# Patient Record
Sex: Female | Born: 1970 | Race: White | Hispanic: No | Marital: Single | Smoking: Former smoker
Health system: Southern US, Community
[De-identification: ages and names within clinical notes are randomized; demographics above are authoritative.]

## PROBLEM LIST (undated history)

## (undated) DIAGNOSIS — I10 Essential (primary) hypertension: Secondary | ICD-10-CM

## (undated) DIAGNOSIS — G43909 Migraine, unspecified, not intractable, without status migrainosus: Secondary | ICD-10-CM

## (undated) DIAGNOSIS — E119 Type 2 diabetes mellitus without complications: Secondary | ICD-10-CM

## (undated) DIAGNOSIS — F319 Bipolar disorder, unspecified: Secondary | ICD-10-CM

## (undated) HISTORY — DX: Bipolar disorder, unspecified: F31.9

---
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
## 1998-02-16 ENCOUNTER — Other Ambulatory Visit: Admission: RE | Admit: 1998-02-16 | Discharge: 1998-02-16 | Payer: Self-pay | Admitting: Obstetrics and Gynecology

## 1998-03-22 ENCOUNTER — Ambulatory Visit (HOSPITAL_COMMUNITY): Admission: RE | Admit: 1998-03-22 | Discharge: 1998-03-22 | Payer: Self-pay | Admitting: Obstetrics and Gynecology

## 1999-12-21 ENCOUNTER — Ambulatory Visit (HOSPITAL_COMMUNITY): Admission: RE | Admit: 1999-12-21 | Discharge: 1999-12-21 | Payer: Self-pay | Admitting: Obstetrics and Gynecology

## 1999-12-21 ENCOUNTER — Encounter: Payer: Self-pay | Admitting: Obstetrics and Gynecology

## 2000-05-16 ENCOUNTER — Inpatient Hospital Stay (HOSPITAL_COMMUNITY): Admission: AD | Admit: 2000-05-16 | Discharge: 2000-05-18 | Payer: Self-pay | Admitting: Obstetrics and Gynecology

## 2000-09-22 ENCOUNTER — Emergency Department (HOSPITAL_COMMUNITY): Admission: EM | Admit: 2000-09-22 | Discharge: 2000-09-22 | Payer: Self-pay | Admitting: Emergency Medicine

## 2020-05-25 ENCOUNTER — Inpatient Hospital Stay (HOSPITAL_COMMUNITY)
Admission: EM | Admit: 2020-05-25 | Discharge: 2020-05-27 | DRG: 071 | Disposition: A | Discharge status: Home / Self Care | Payer: Self-pay | Attending: Internal Medicine | Admitting: Internal Medicine

## 2020-05-25 ENCOUNTER — Emergency Department (HOSPITAL_COMMUNITY): Payer: Self-pay

## 2020-05-25 ENCOUNTER — Encounter (HOSPITAL_COMMUNITY): Payer: Self-pay

## 2020-05-25 ENCOUNTER — Other Ambulatory Visit: Payer: Self-pay

## 2020-05-25 DIAGNOSIS — E538 Deficiency of other specified B group vitamins: Secondary | ICD-10-CM | POA: Diagnosis present

## 2020-05-25 DIAGNOSIS — R4182 Altered mental status, unspecified: Secondary | ICD-10-CM | POA: Diagnosis present

## 2020-05-25 DIAGNOSIS — R4701 Aphasia: Secondary | ICD-10-CM | POA: Diagnosis present

## 2020-05-25 DIAGNOSIS — G4700 Insomnia, unspecified: Secondary | ICD-10-CM | POA: Diagnosis present

## 2020-05-25 DIAGNOSIS — E119 Type 2 diabetes mellitus without complications: Secondary | ICD-10-CM | POA: Diagnosis present

## 2020-05-25 DIAGNOSIS — R258 Other abnormal involuntary movements: Secondary | ICD-10-CM

## 2020-05-25 DIAGNOSIS — Z79899 Other long term (current) drug therapy: Secondary | ICD-10-CM

## 2020-05-25 DIAGNOSIS — F419 Anxiety disorder, unspecified: Secondary | ICD-10-CM | POA: Diagnosis present

## 2020-05-25 DIAGNOSIS — G10 Huntington's disease: Secondary | ICD-10-CM | POA: Diagnosis present

## 2020-05-25 DIAGNOSIS — F319 Bipolar disorder, unspecified: Secondary | ICD-10-CM | POA: Diagnosis present

## 2020-05-25 DIAGNOSIS — Z20822 Contact with and (suspected) exposure to covid-19: Secondary | ICD-10-CM | POA: Diagnosis present

## 2020-05-25 DIAGNOSIS — E876 Hypokalemia: Secondary | ICD-10-CM | POA: Diagnosis present

## 2020-05-25 DIAGNOSIS — N390 Urinary tract infection, site not specified: Secondary | ICD-10-CM | POA: Diagnosis present

## 2020-05-25 DIAGNOSIS — R4781 Slurred speech: Principal | ICD-10-CM

## 2020-05-25 DIAGNOSIS — G255 Other chorea: Secondary | ICD-10-CM

## 2020-05-25 DIAGNOSIS — G9340 Encephalopathy, unspecified: Principal | ICD-10-CM | POA: Diagnosis present

## 2020-05-25 DIAGNOSIS — G4710 Hypersomnia, unspecified: Secondary | ICD-10-CM | POA: Diagnosis present

## 2020-05-25 DIAGNOSIS — Z87891 Personal history of nicotine dependence: Secondary | ICD-10-CM

## 2020-05-25 DIAGNOSIS — I10 Essential (primary) hypertension: Secondary | ICD-10-CM | POA: Diagnosis present

## 2020-05-25 HISTORY — DX: Migraine, unspecified, not intractable, without status migrainosus: G43.909

## 2020-05-25 HISTORY — DX: Essential (primary) hypertension: I10

## 2020-05-25 LAB — CBC WITH DIFFERENTIAL/PLATELET
Abs Immature Granulocytes: 0.02 10*3/uL (ref 0.00–0.07)
Basophils Absolute: 0 10*3/uL (ref 0.0–0.1)
Basophils Relative: 0 %
Eosinophils Absolute: 0 10*3/uL (ref 0.0–0.5)
Eosinophils Relative: 0 %
HCT: 43.9 % (ref 36.0–46.0)
Hemoglobin: 14.3 g/dL (ref 12.0–15.0)
Immature Granulocytes: 0 %
Lymphocytes Relative: 17 %
Lymphs Abs: 1.2 10*3/uL (ref 0.7–4.0)
MCH: 30.8 pg (ref 26.0–34.0)
MCHC: 32.6 g/dL (ref 30.0–36.0)
MCV: 94.4 fL (ref 80.0–100.0)
Monocytes Absolute: 0.5 10*3/uL (ref 0.1–1.0)
Monocytes Relative: 6 %
Neutro Abs: 5.6 10*3/uL (ref 1.7–7.7)
Neutrophils Relative %: 77 %
Platelets: 439 10*3/uL — ABNORMAL HIGH (ref 150–400)
RBC: 4.65 MIL/uL (ref 3.87–5.11)
RDW: 12.8 % (ref 11.5–15.5)
WBC: 7.3 10*3/uL (ref 4.0–10.5)
nRBC: 0 % (ref 0.0–0.2)

## 2020-05-25 LAB — COMPREHENSIVE METABOLIC PANEL
ALT: 32 U/L (ref 0–44)
AST: 112 U/L — ABNORMAL HIGH (ref 15–41)
Albumin: 4.5 g/dL (ref 3.5–5.0)
Alkaline Phosphatase: 84 U/L (ref 38–126)
Anion gap: 14 (ref 5–15)
BUN: 10 mg/dL (ref 6–20)
CO2: 22 mmol/L (ref 22–32)
Calcium: 9.3 mg/dL (ref 8.9–10.3)
Chloride: 98 mmol/L (ref 98–111)
Creatinine, Ser: 0.81 mg/dL (ref 0.44–1.00)
GFR, Estimated: >60 mL/min (ref >60)
Glucose, Bld: 100 mg/dL — ABNORMAL HIGH (ref 70–99)
Potassium: 3.6 mmol/L (ref 3.5–5.1)
Sodium: 134 mmol/L — ABNORMAL LOW (ref 135–145)
Total Bilirubin: 0.6 mg/dL (ref 0.3–1.2)
Total Protein: 8.9 g/dL — ABNORMAL HIGH (ref 6.5–8.1)

## 2020-05-25 LAB — RAPID URINE DRUG SCREEN, HOSP PERFORMED
Amphetamines: POSITIVE — AB
Barbiturates: NOT DETECTED
Benzodiazepines: POSITIVE — AB
Cocaine: NOT DETECTED
Opiates: NOT DETECTED
Tetrahydrocannabinol: NOT DETECTED

## 2020-05-25 LAB — URINALYSIS, ROUTINE W REFLEX MICROSCOPIC
Bilirubin Urine: NEGATIVE
Glucose, UA: NEGATIVE mg/dL
Ketones, ur: NEGATIVE mg/dL
Nitrite: POSITIVE — AB
Protein, ur: NEGATIVE mg/dL
Specific Gravity, Urine: 1.009 (ref 1.005–1.030)
WBC, UA: >50 WBC/hpf — ABNORMAL HIGH (ref 0–5)
pH: 6 (ref 5.0–8.0)

## 2020-05-25 LAB — ETHANOL: Alcohol, Ethyl (B): <10 mg/dL (ref <10)

## 2020-05-25 LAB — AMMONIA: Ammonia: 21 umol/L (ref 9–35)

## 2020-05-25 LAB — RESPIRATORY PANEL BY RT PCR (FLU A&B, COVID)
Influenza A by PCR: NEGATIVE
Influenza B by PCR: NEGATIVE
SARS Coronavirus 2 by RT PCR: NEGATIVE

## 2020-05-25 MED ORDER — LORAZEPAM 2 MG/ML IJ SOLN
1.0000 mg | Freq: Once | INTRAMUSCULAR | Status: AC
  Administered 2020-05-25: 1 mg via INTRAVENOUS
  Filled 2020-05-25: qty 1

## 2020-05-25 MED ORDER — ENOXAPARIN SODIUM 40 MG/0.4ML ~~LOC~~ SOLN
40.0000 mg | SUBCUTANEOUS | Status: DC
  Administered 2020-05-25 – 2020-05-26 (×2): 40 mg via SUBCUTANEOUS
  Filled 2020-05-25 (×2): qty 0.4

## 2020-05-25 MED ORDER — THIAMINE HCL 100 MG/ML IJ SOLN
100.0000 mg | Freq: Every day | INTRAMUSCULAR | Status: DC
  Administered 2020-05-25 – 2020-05-27 (×3): 100 mg via INTRAVENOUS
  Filled 2020-05-25 (×3): qty 2

## 2020-05-25 MED ORDER — ONDANSETRON HCL 4 MG/2ML IJ SOLN: 4.0000 mg | Freq: Four times a day (QID) | INTRAMUSCULAR | Status: DC | PRN

## 2020-05-25 MED ORDER — SODIUM CHLORIDE 0.9 % IV SOLN
INTRAVENOUS | Status: AC
Start: 2020-05-25 — End: 2020-05-26

## 2020-05-25 MED ORDER — ACETAMINOPHEN 650 MG RE SUPP: 650.0000 mg | Freq: Four times a day (QID) | RECTAL | Status: DC | PRN

## 2020-05-25 MED ORDER — ACETAMINOPHEN 325 MG PO TABS
650.0000 mg | ORAL_TABLET | Freq: Four times a day (QID) | ORAL | Status: DC | PRN
  Administered 2020-05-26 (×2): 650 mg via ORAL
  Filled 2020-05-25 (×2): qty 2

## 2020-05-25 MED ORDER — METOCLOPRAMIDE HCL 5 MG/ML IJ SOLN
5.0000 mg | Freq: Once | INTRAMUSCULAR | Status: AC
  Administered 2020-05-25: 5 mg via INTRAVENOUS
  Filled 2020-05-25: qty 2

## 2020-05-25 MED ORDER — SODIUM CHLORIDE 0.9 % IV SOLN
1.0000 g | INTRAVENOUS | Status: DC
  Administered 2020-05-25 – 2020-05-26 (×2): 1 g via INTRAVENOUS
  Filled 2020-05-25: qty 1
  Filled 2020-05-25 (×3): qty 10

## 2020-05-25 MED ORDER — ONDANSETRON HCL 4 MG PO TABS: 4.0000 mg | ORAL_TABLET | Freq: Four times a day (QID) | ORAL | Status: DC | PRN

## 2020-05-25 NOTE — ED Notes (Signed)
Patient states she is unable to provide a urine sample at this time.

## 2020-05-25 NOTE — Consult Note (Signed)
NEURO HOSPITALIST CONSULT NOTE   Requestig physician: Dr. Freida Busman  Reason for Consult: AMS and chorea  History obtained from:  Patient, Husband and Chart   HPI:                                                                                                                                          Glenda Smith is an 49 y.o. female with a PMHx of DM, HTN and migraine, who presented to the [facility] ED with intermittent episodes of confusion and slurred speech.  Husband states that these last for several minutes and then resolve spontaneously.  She has had cognitive and memory problems with gradual worsening over the past 2 years, per husband. Also was diagnosed with bipolar disorder in the past 2 years. She had no headache on presenting, but now complains of a 6/10 headache. She has some discomfort behind her eyes but this is not painful. No vision loss, but she has been experiencing abnormal upward eye movements recently. In addition, she has had writhing movements of her body today, with some such movements intermittently over the past 3 months, per husband. No family history of chorea. No seizure like episodes. She has no fever or neck stiffness. Also with no recent medication changes. Husband denies any illicit substance or alcohol abuse.    Past Medical History:  Diagnosis Date  . Diabetes mellitus without complication (HCC)   . Hypertension   . Migraine     History reviewed. No pertinent surgical history.  History reviewed. No pertinent family history.            Social History:  reports that she has quit smoking. She has never used smokeless tobacco. No history on file for alcohol use and drug use.  No Known Allergies  MEDICATIONS:                                                                                                                      No current facility-administered medications on file prior to encounter.   Current Outpatient Medications on File Prior to  Encounter  Medication Sig Dispense Refill  . ALPRAZolam (XANAX) 0.5 MG tablet Take 0.5 mg by mouth 4 (four) times daily as needed for anxiety or sleep.     Marland Kitchen  amphetamine-dextroamphetamine (ADDERALL) 20 MG tablet Take 20 mg by mouth daily.     . cholecalciferol (VITAMIN D3) 25 MCG (1000 UNIT) tablet Take 1,000 Units by mouth daily.    Marland Kitchen ibuprofen (ADVIL) 200 MG tablet Take 400 mg by mouth every 6 (six) hours as needed for moderate pain.       ROS:                                                                                                                                       As per HPI. Comprehensive ROS otherwise negative.   Blood pressure 126/77, pulse 81, temperature 99.4 F (37.4 C), temperature source Oral, resp. rate 16, height 5\' 3"  (1.6 m), weight 65.8 kg, SpO2 93 %.   General Examination:                                                                                                       Physical Exam  HEENT-  [place]/AT    Lungs- Respirations unlabored Extremities- No edema   Neurological Examination Mental Status: Awake and alert. Fully oriented x 5. Speech fluent with intact naming and comprehension. Mild intermittent dysarthria.  Cranial Nerves: II: Visual fields intact bilaterally. PERRL, 5 mm >> 3 mm.   III,IV, VI: No ptosis. Has intermittent conjugate supraduction of eyes as well as looking up and to the left that resolves when she is asked to fixate on examiner's face and other objects. The abnormal-appearing eye movements are also associated with spasmodic appearing eye closure and increased dysarthria and choreiform movements of her neck, all of which resolve when distracted to perform other tasks.  V,VII: Smile symmetric, facial temp sensation equal bilaterally VIII: hearing intact to voice IX,X: Palate rises symmetrically XI: Symmetric shoulder shrug XII: midline tongue extension Motor: Inconsistent effort on exam. Maximum strength elicited is 4+/5 x 4. Needs to  be coached to resist examiner with max strength. Intermittent choreiform movements of all 4 extremities wax and wane but were not consistently distractible.  Sensory: Temp and light touch intact throughout, bilaterally. No extinction to DSS.  Deep Tendon Reflexes: 2+ and symmetric BUE. 3+ patellae.  Plantars: Right: downgoing   Left: downgoing Cerebellar: No ataxia with FNF bilaterally Gait: Able to ambulate without assistance, but there is mild choreiform "dancing" like quality to her gait.    Lab Results: Basic Metabolic Panel: Recent Labs  Lab 05/25/20 1800  NA 134*  K 3.6  CL 98  CO2 22  GLUCOSE  100*  BUN 10  CREATININE 0.81  CALCIUM 9.3    CBC: Recent Labs  Lab 05/25/20 1800  WBC 7.3  NEUTROABS 5.6  HGB 14.3  HCT 43.9  MCV 94.4  PLT 439*    Cardiac Enzymes: No results for input(s): CKTOTAL, CKMB, CKMBINDEX, TROPONINI in the last 168 hours.  Lipid Panel: No results for input(s): CHOL, TRIG, HDL, CHOLHDL, VLDL, LDLCALC in the last 168 hours.  Imaging: CT Head Wo Contrast  Result Date: 05/25/2020 CLINICAL DATA:  Slurred speech, rambling, confusion. Last known normal at noon. Cerebral hemorrhage suspected. EXAM: CT HEAD WITHOUT CONTRAST TECHNIQUE: Contiguous axial images were obtained from the base of the skull through the vertex without intravenous contrast. COMPARISON:  None. FINDINGS: Brain: No evidence of large-territorial acute infarction. No parenchymal hemorrhage. No mass lesion. No extra-axial collection. No mass effect or midline shift. No hydrocephalus. Basilar cisterns are patent. Vascular: No hyperdense vessel. Skull: No acute fracture or focal lesion. Sinuses/Orbits: Paranasal sinuses and mastoid air cells are clear. The orbits are unremarkable. Other: None. IMPRESSION: No acute intracranial abnormality. If high clinical suspicion for acute stroke, please consider MR brain noncontrast for further/mores sensitive evaluation. Electronically Signed   By: Tish FredericksonMorgane   Naveau M.D.   On: 05/25/2020 19:08   MR Brain Wo Contrast (neuro protocol)  Result Date: 05/25/2020 CLINICAL DATA:  Initial evaluation for acute slurred speech, confusion. EXAM: MRI HEAD WITHOUT CONTRAST TECHNIQUE: Multiplanar, multiecho pulse sequences of the brain and surrounding structures were obtained without intravenous contrast. COMPARISON:  Prior CT from earlier same day. FINDINGS: Brain: Cerebral volume within normal limits for patient age. Single subcentimeter focus of FLAIR hyperintensity noted within the white matter of the right frontal corona radiata, nonspecific, but of doubtful significance in isolation. No other focal parenchymal signal abnormality. No abnormal foci of restricted diffusion to suggest acute or subacute ischemia. Gray-white matter differentiation well maintained. No encephalomalacia to suggest chronic infarction. No foci of susceptibility artifact to suggest acute or chronic intracranial hemorrhage. No mass lesion, midline shift or mass effect. No hydrocephalus. No extra-axial fluid collection. Major dural sinuses are grossly patent. Pituitary gland and suprasellar region are normal. Midline structures intact and normal. Vascular: Major intracranial vascular flow voids well maintained and normal in appearance. Skull and upper cervical spine: Craniocervical junction normal. Visualized upper cervical spine within normal limits. Bone marrow signal intensity normal. No scalp soft tissue abnormality. Sinuses/Orbits: Globes and orbital soft tissues within normal limits. Minimal mucosal thickening noted within the ethmoidal air cells. Paranasal sinuses are otherwise clear. No mastoid effusion. Inner ear structures grossly normal. Other: None. IMPRESSION: Normal brain MRI. No acute intracranial abnormality identified. Electronically Signed   By: Rise MuBenjamin  McClintock M.D.   On: 05/25/2020 22:56    Assessment: 49 year old female with a 2 year history of progressive memory problems and  confusion, approximately 3 month history of intermittent choreiform movements, presenting after an episode of slurred speech, rambling and confusion witnessed by husband 1. Exam reveals choreiform movements of all limbs that vary in amplitude and frequency, in conjunction with mild slurring of speech and facial as well as ocular dyskinetic movements.  2. Exam findings best localize to the basal ganglia, most likely diffuse involvement, versus psychogenic movements. Also on the DDx is effect of possible toxic ingestion. No exposure to toxic solvents or carbon monoxide per husband. She has no family history of chorea, but Huntington's chorea is also a differential diagnostic consideration.  3. MRI brain is normal.  4. Was diagnosed  with bipolar disorder in the past 2 years. Of note, mood disorders can occur in both paraneoplastic syndromes and Huntington's chorea 5. Regarding the paraneoplastic component of the DDx, per the literature the most frequently associated cancer in paraneoplastic syndromes resulting in chorea was small cell lung cancer (SCLC); lymphoma, bowel, or kidney cancers were also reported. CV2/CRMP5 was the most frequently associated antibody, followed by Hu. Hyperintense lesions of the basal ganglia on T2-weighted images were seldom observed.  6. A mild serotonin syndrome is possible. Would discontinue her Adderall and avoid SSRIs, tricyclics and other medications with serotonergic activity.  7. Lab testing for possible Huntington's may need to be undertaken as an outpatient. The Huntington's Disease Society of [place] recommends that at risk individuals who are considering genetic testing do so at a genetic testing center that follows the HDSA guidelines. Testing procedures at these centers involves sessions with professionals who are knowledgeable about HD and the local services available. It may take several weeks to receive the results once the genetic test is  complete.  Recommendations: 1. May need an LP in the morning to assess for inflammatory markers in her CSF. Would most likely need to be obtained under sedation with fluoroscopy due to her choreiform movements.  2. Paraneoplastic work up to include CT of chest, abdomen and pelvis.  3. Serum anti-NMDA antibody titer, anti Hu antibody titer and  anti CRMP5 antibody titer. These have been ordered as a Clinical biochemist.   Electronically signed: Dr. Caryl Pina 05/25/2020, 11:37 PM

## 2020-05-25 NOTE — ED Notes (Signed)
Patient transported to MRI 

## 2020-05-25 NOTE — ED Triage Notes (Signed)
Per husband slurred speech, rambling, and confusion. LKN 1200.

## 2020-05-25 NOTE — ED Provider Notes (Signed)
[facility]-EMERGENCY DEPT Provider Note   CSN: [id number] Arrival date & time: 05/25/20  1748     History Chief Complaint  Patient presents with  . Aphasia    Glenda Smith is a 49 y.o. female.  49 year old female presents with intermittent episodes of confusion with slurred speech.  Husband states that these last for several minutes.  They resolve spontaneously.  No prior history of same.  She denies any recent fever or headache.  No changes to her medications.  Has had multiple episodes of this today.  She denies any use of alcohol or drugs.  She is not fully vaccinated        Past Medical History:  Diagnosis Date  . Diabetes mellitus without complication (HCC)   . Hypertension   . Migraine     There are no problems to display for this patient.   History reviewed. No pertinent surgical history.   OB History   No obstetric history on file.     History reviewed. No pertinent family history.  Social History   Tobacco Use  . Smoking status: Former Games developer  . Smokeless tobacco: Never Used  Substance Use Topics  . Alcohol use: Not on file  . Drug use: Not on file    Home Medications Prior to Admission medications   Not on File    Allergies    Patient has no known allergies.  Review of Systems   Review of Systems  All other systems reviewed and are negative.   Physical Exam Updated Vital Signs BP (!) 142/95   Pulse 82   Temp 99.4 F (37.4 C) (Oral)   Resp 16   Ht 1.6 m (5\' 3" )   Wt 65.8 kg   SpO2 99%   BMI 25.69 kg/m   Physical Exam Vitals and nursing note reviewed.  Constitutional:      General: She is not in acute distress.    Appearance: Normal appearance. She is well-developed. She is not toxic-appearing.  HENT:     Head: Normocephalic and atraumatic.  Eyes:     General: Lids are normal.     Conjunctiva/sclera: Conjunctivae normal.     Pupils: Pupils are equal, round, and reactive to light.  Neck:      Thyroid: No thyroid mass.     Trachea: No tracheal deviation.  Cardiovascular:     Rate and Rhythm: Normal rate and regular rhythm.     Heart sounds: Normal heart sounds. No murmur heard.  No gallop.   Pulmonary:     Effort: Pulmonary effort is normal. No respiratory distress.     Breath sounds: Normal breath sounds. No stridor. No decreased breath sounds, wheezing, rhonchi or rales.  Abdominal:     General: Bowel sounds are normal. There is no distension.     Palpations: Abdomen is soft.     Tenderness: There is no abdominal tenderness. There is no rebound.  Musculoskeletal:        General: No tenderness. Normal range of motion.     Cervical back: Normal range of motion and neck supple.  Skin:    General: Skin is warm and dry.     Findings: No abrasion or rash.  Neurological:     Mental Status: She is alert and oriented to person, place, and time.     GCS: GCS eye subscore is 4. GCS verbal subscore is 5. GCS motor subscore is 6.     Cranial Nerves: No cranial nerve deficit.  Sensory: No sensory deficit.     Motor: Weakness and tremor present.     Coordination: Finger-Nose-Finger Test abnormal.  Psychiatric:        Attention and Perception: Attention normal.        Mood and Affect: Affect is flat.        Speech: Speech is delayed.        Behavior: Behavior is slowed and withdrawn.     ED Results / Procedures / Treatments   Labs (all labs ordered are listed, but only abnormal results are displayed) Labs Reviewed  AMMONIA  CBC WITH DIFFERENTIAL/PLATELET  COMPREHENSIVE METABOLIC PANEL  ETHANOL  RAPID URINE DRUG SCREEN, HOSP PERFORMED  URINALYSIS, ROUTINE W REFLEX MICROSCOPIC    EKG None  Radiology No results found.  Procedures Procedures (including critical care time)  Medications Ordered in ED Medications  0.9 %  sodium chloride infusion (has no administration in time range)    ED Course  I have reviewed the triage vital signs and the nursing  notes.  Pertinent labs & imaging results that were available during my care of the patient were reviewed by me and considered in my medical decision making (see chart for details).    MDM Rules/Calculators/A&P                          Head CT negative.  Labs are reassuring here.  Discussed with Dr. Otelia Limes from neurology who has seen the patient in the ER.  Recommends hospitalist admission as well as MRI of brain without contrast Final Clinical Impression(s) / ED Diagnoses Final diagnoses:  None    Rx / DC Orders ED Discharge Orders    None       Lorre Nick, MD 05/25/20 2149

## 2020-05-25 NOTE — H&P (Signed)
[facility] History and Physical  Glenda Smith CBS:[id number] DOB: Feb 25, 1971 DOA: 05/25/2020   PCP: Patient, No Pcp Per  Specialists: None  Chief Complaint: Altered mental status  HPI: Glenda Smith is a 49 y.o. female with a past medical history of bipolar disorder, anxiety disorder who lives with her husband who was brought in due to intermittent episodes of confusion.  Currently patient is minimally responsive and is unable to provide any history.  Most of the history was obtained from the patient's husband.  Apparently for the past many weeks patient has had episodes where she would suddenly lose balance and fall down.  She would have episodes where her eyes were rolled back into her head.  Husbands states that there would days when she would not be able to sleep for 48 hours and then will be sleeping for 24 to 48 hours.  Over the past 2 days patient was noted to speak sentences which did not made any sense.  She was noted to have slurred speech.  Husband did not notice any weakness on any one side of the body.  No seizure type episode.  No nausea vomiting has been noted.  History is limited.  In the emergency department evaluation revealed normal WBC.  Sodium was 134.  AST noted to be 112.  Ammonia level 21.  CT head was unremarkable.  MRI brain was subsequently done which was also negative for any acute process.  Urine drug screen was positive for amphetamines and benzodiazepine.  UA noted to be abnormal with large leukocytes positive nitrite many bacteria and greater than 50 WBC.  Home Medications: Prior to Admission medications   Medication Sig Start Date End Date Taking? Authorizing Provider  ALPRAZolam Prudy Feeler) 0.5 MG tablet Take 0.5 mg by mouth 4 (four) times daily as needed for anxiety or sleep.  04/21/20  Yes [provider]  amphetamine-dextroamphetamine (ADDERALL) 20 MG tablet Take 20 mg by mouth daily.  04/22/20  Yes [provider]  cholecalciferol  (VITAMIN D3) 25 MCG (1000 UNIT) tablet Take 1,000 Units by mouth daily.   Yes [provider]  ibuprofen (ADVIL) 200 MG tablet Take 400 mg by mouth every 6 (six) hours as needed for moderate pain.   Yes [provider]    Allergies: No Known Allergies  Past Medical History: Past Medical History:  Diagnosis Date  . Diabetes mellitus without complication (HCC)   . Hypertension   . Migraine     History reviewed. No pertinent surgical history.  Social History: Lives with her husband.  No history of smoking alcohol use.  Family History:  Unable to obtain at this time due to her altered mentation  Review of Systems -unable to do due to altered mental status  Physical Examination  Vitals:   05/25/20 1930 05/25/20 2000 05/25/20 2100 05/25/20 2130  BP: 131/81 131/81 130/85 126/77  Pulse: 81 80 83 81  Resp: 16 (!) 21 17 16   Temp:      TempSrc:      SpO2: 97% 99% 97% 93%  Weight:      Height:        BP 126/77   Pulse 81   Temp 99.4 F (37.4 C) (Oral)   Resp 16   Ht 5\' 3"  (1.6 m)   Wt 65.8 kg   SpO2 93%   BMI 25.69 kg/m   General appearance: no distress and slowed mentation Head: Normocephalic, without obvious abnormality, atraumatic Eyes: conjunctivae/corneas clear. PERRL, Throat: lips,  mucosa, and tongue normal; teeth and gums normal Neck: no adenopathy, no carotid bruit, no JVD, supple, symmetrical, trachea midline and thyroid not enlarged, symmetric, no tenderness/mass/nodules Resp: clear to auscultation bilaterally Cardio: regular rate and rhythm, S1, S2 normal, no murmur, click, rub or gallop GI: soft, non-tender; bowel sounds normal; no masses,  no organomegaly Extremities: extremities normal, atraumatic, no cyanosis or edema Pulses: 2+ and symmetric Skin: Skin color, texture, turgor normal. No rashes or lesions Lymph nodes: Cervical, supraclavicular, and axillary nodes normal. Neurologic: Opens her eyes on voice command.  Does not answer any  orientation questions.  Does squeeze my fingers equally with both her hands.  Able to move her legs.  No obvious focal deficits noted.  No facial asymmetry.   Labs on Admission: I have personally reviewed following labs and imaging studies  CBC: Recent Labs  Lab 05/25/20 1800  WBC 7.3  NEUTROABS 5.6  HGB 14.3  HCT 43.9  MCV 94.4  PLT 439*   Basic Metabolic Panel: Recent Labs  Lab 05/25/20 1800  NA 134*  K 3.6  CL 98  CO2 22  GLUCOSE 100*  BUN 10  CREATININE 0.81  CALCIUM 9.3   GFR: Estimated Creatinine Clearance: 75 mL/min (by C-G formula based on SCr of 0.81 mg/dL). Liver Function Tests: Recent Labs  Lab 05/25/20 1800  AST 112*  ALT 32  ALKPHOS 84  BILITOT 0.6  PROT 8.9*  ALBUMIN 4.5   No results for input(s): LIPASE, AMYLASE in the last 168 hours. Recent Labs  Lab 05/25/20 1923  AMMONIA 21     Radiological Exams on Admission: CT Head Wo Contrast  Result Date: 05/25/2020 CLINICAL DATA:  Slurred speech, rambling, confusion. Last known normal at noon. Cerebral hemorrhage suspected. EXAM: CT HEAD WITHOUT CONTRAST TECHNIQUE: Contiguous axial images were obtained from the base of the skull through the vertex without intravenous contrast. COMPARISON:  None. FINDINGS: Brain: No evidence of large-territorial acute infarction. No parenchymal hemorrhage. No mass lesion. No extra-axial collection. No mass effect or midline shift. No hydrocephalus. Basilar cisterns are patent. Vascular: No hyperdense vessel. Skull: No acute fracture or focal lesion. Sinuses/Orbits: Paranasal sinuses and mastoid air cells are clear. The orbits are unremarkable. Other: None. IMPRESSION: No acute intracranial abnormality. If high clinical suspicion for acute stroke, please consider MR brain noncontrast for further/mores sensitive evaluation. Electronically Signed   By: Tish Frederickson M.D.   On: 05/25/2020 19:08   MR Brain Wo Contrast (neuro protocol)  Result Date: 05/25/2020 CLINICAL DATA:   Initial evaluation for acute slurred speech, confusion. EXAM: MRI HEAD WITHOUT CONTRAST TECHNIQUE: Multiplanar, multiecho pulse sequences of the brain and surrounding structures were obtained without intravenous contrast. COMPARISON:  Prior CT from earlier same day. FINDINGS: Brain: Cerebral volume within normal limits for patient age. Single subcentimeter focus of FLAIR hyperintensity noted within the white matter of the right frontal corona radiata, nonspecific, but of doubtful significance in isolation. No other focal parenchymal signal abnormality. No abnormal foci of restricted diffusion to suggest acute or subacute ischemia. Gray-white matter differentiation well maintained. No encephalomalacia to suggest chronic infarction. No foci of susceptibility artifact to suggest acute or chronic intracranial hemorrhage. No mass lesion, midline shift or mass effect. No hydrocephalus. No extra-axial fluid collection. Major dural sinuses are grossly patent. Pituitary gland and suprasellar region are normal. Midline structures intact and normal. Vascular: Major intracranial vascular flow voids well maintained and normal in appearance. Skull and upper cervical spine: Craniocervical junction normal. Visualized upper cervical spine within  normal limits. Bone marrow signal intensity normal. No scalp soft tissue abnormality. Sinuses/Orbits: Globes and orbital soft tissues within normal limits. Minimal mucosal thickening noted within the ethmoidal air cells. Paranasal sinuses are otherwise clear. No mastoid effusion. Inner ear structures grossly normal. Other: None. IMPRESSION: Normal brain MRI. No acute intracranial abnormality identified. Electronically Signed   By: Rise Mu M.D.   On: 05/25/2020 22:56      Problem List  Active Problems:   AMS (altered mental status)   Assessment: This is a 49 year old Caucasian female with past medical history as stated earlier who comes in with a few week history of  loss of balance, slurred speech, hypersomnolence.  Work-up so far has been negative for any active neurological process.  Her UA however is noted to be abnormal. It is quite likely she has a UTI.  However her symptoms are very longstanding.  Could be due to an underlying psychiatric illness.  Patient does have a known history of bipolar disorder.  Plan:  1. Acute encephalopathy: Could be due to UTI but see discussion above.  We will treat her with antibiotics for the UTI.  Neurology was consulted by ED provider.  MRI brain is negative.  We will proceed with EEG.  Do metabolic work-up.  Ammonia level was noted to be normal.  Also consult psychiatry to evaluate her.  2. Urinary tract infection: Follow-up urine culture.  Treat with ceftriaxone for now.  3.  History of bipolar disorder: According to her husband she is followed by a psychiatrist, Dr. Evelene Croon.  Noted to be on alprazolam and Adderall at home which will be held for now.     DVT Prophylaxis: Lovenox Code Status: Full code Family Communication: Discussed with her husband Disposition: Hopefully return home in improved Consults called: Neurology, psychiatry Admission Status: Status is: Observation  The patient remains OBS appropriate and will d/c before 2 midnights.  Dispo: The patient is from: Home              Anticipated d/c is to: Home              Anticipated d/c date is: 1 day              Patient currently is not medically stable to d/c.     Severity of Illness: The appropriate patient status for this patient is OBSERVATION. Observation status is judged to be reasonable and necessary in order to provide the required intensity of service to ensure the patient's safety. The patient's presenting symptoms, physical exam findings, and initial radiographic and laboratory data in the context of their medical condition is felt to place them at decreased risk for further clinical deterioration. Furthermore, it is anticipated that the  patient will be medically stable for discharge from the hospital within 2 midnights of admission. The following factors support the patient status of observation.   " The patient's presenting symptoms include altered mental status. " The physical exam findings include minimally responsive. " The initial radiographic and laboratory data are concerning for UTI.     Further management decisions will depend on results of further testing and patient's response to treatment.   Malekai [facility]  [facility] [employer] on [email or web address]  05/25/2020, 11:13 PM

## 2020-05-26 ENCOUNTER — Observation Stay (HOSPITAL_COMMUNITY)
Admit: 2020-05-26 | Discharge: 2020-05-26 | Disposition: A | Discharge status: Home / Self Care | Payer: Self-pay | Attending: Internal Medicine | Admitting: Internal Medicine

## 2020-05-26 DIAGNOSIS — E876 Hypokalemia: Secondary | ICD-10-CM

## 2020-05-26 DIAGNOSIS — R258 Other abnormal involuntary movements: Secondary | ICD-10-CM

## 2020-05-26 DIAGNOSIS — Z8659 Personal history of other mental and behavioral disorders: Secondary | ICD-10-CM

## 2020-05-26 DIAGNOSIS — R4182 Altered mental status, unspecified: Secondary | ICD-10-CM | POA: Diagnosis present

## 2020-05-26 DIAGNOSIS — G9341 Metabolic encephalopathy: Secondary | ICD-10-CM

## 2020-05-26 LAB — HEPATITIS PANEL, ACUTE
HCV Ab: NONREACTIVE
Hep A IgM: NONREACTIVE
Hep B C IgM: NONREACTIVE
Hepatitis B Surface Ag: NONREACTIVE

## 2020-05-26 LAB — COMPREHENSIVE METABOLIC PANEL
ALT: 33 U/L (ref 0–44)
AST: 124 U/L — ABNORMAL HIGH (ref 15–41)
Albumin: 3.4 g/dL — ABNORMAL LOW (ref 3.5–5.0)
Alkaline Phosphatase: 61 U/L (ref 38–126)
Anion gap: 10 (ref 5–15)
BUN: 9 mg/dL (ref 6–20)
CO2: 21 mmol/L — ABNORMAL LOW (ref 22–32)
Calcium: 8.3 mg/dL — ABNORMAL LOW (ref 8.9–10.3)
Chloride: 105 mmol/L (ref 98–111)
Creatinine, Ser: 0.74 mg/dL (ref 0.44–1.00)
GFR, Estimated: >60 mL/min (ref >60)
Glucose, Bld: 96 mg/dL (ref 70–99)
Potassium: 3.3 mmol/L — ABNORMAL LOW (ref 3.5–5.1)
Sodium: 136 mmol/L (ref 135–145)
Total Bilirubin: 0.8 mg/dL (ref 0.3–1.2)
Total Protein: 6.7 g/dL (ref 6.5–8.1)

## 2020-05-26 LAB — VITAMIN B12: Vitamin B-12: 236 pg/mL (ref 180–914)

## 2020-05-26 LAB — CBC
HCT: 37.8 % (ref 36.0–46.0)
Hemoglobin: 11.7 g/dL — ABNORMAL LOW (ref 12.0–15.0)
MCH: 30.4 pg (ref 26.0–34.0)
MCHC: 31 g/dL (ref 30.0–36.0)
MCV: 98.2 fL (ref 80.0–100.0)
Platelets: 329 10*3/uL (ref 150–400)
RBC: 3.85 MIL/uL — ABNORMAL LOW (ref 3.87–5.11)
RDW: 12.8 % (ref 11.5–15.5)
WBC: 4.7 10*3/uL (ref 4.0–10.5)
nRBC: 0 % (ref 0.0–0.2)

## 2020-05-26 LAB — GLUCOSE, CAPILLARY: Glucose-Capillary: 83 mg/dL (ref 70–99)

## 2020-05-26 LAB — RPR: RPR Ser Ql: NONREACTIVE

## 2020-05-26 LAB — TSH: TSH: 9.362 u[IU]/mL — ABNORMAL HIGH (ref 0.350–4.500)

## 2020-05-26 LAB — T4, FREE: Free T4: 0.67 ng/dL (ref 0.61–1.12)

## 2020-05-26 LAB — HIV ANTIBODY (ROUTINE TESTING W REFLEX): HIV Screen 4th Generation wRfx: NONREACTIVE

## 2020-05-26 MED ORDER — VITAMIN B-12 1000 MCG PO TABS: 1000.0000 ug | ORAL_TABLET | Freq: Every day | ORAL | Status: DC

## 2020-05-26 MED ORDER — TRAZODONE HCL 50 MG PO TABS
50.0000 mg | ORAL_TABLET | Freq: Once | ORAL | Status: AC
  Administered 2020-05-26: 50 mg via ORAL
  Filled 2020-05-26: qty 1

## 2020-05-26 MED ORDER — POTASSIUM CHLORIDE 10 MEQ/100ML IV SOLN
10.0000 meq | INTRAVENOUS | Status: AC
  Administered 2020-05-26 (×3): 10 meq via INTRAVENOUS
  Filled 2020-05-26 (×2): qty 100

## 2020-05-26 MED ORDER — CYANOCOBALAMIN 1000 MCG/ML IJ SOLN
1000.0000 ug | Freq: Every day | INTRAMUSCULAR | Status: DC
  Administered 2020-05-26 – 2020-05-27 (×2): 1000 ug via INTRAMUSCULAR
  Filled 2020-05-26 (×4): qty 1

## 2020-05-26 NOTE — Procedures (Signed)
Patient Name: Glenda Smith  MRN: [id number]  Epilepsy Attending: Charlsie Quest  Referring Physician/Provider: Dr Osvaldo Shipper Date: 05/26/2020 Duration: 23.54 mins  Patient history: 49 year old female with 2-year history of progressive memory problems and confusion, 63-month history of intermittent choreiform movements who presented after an episode of slurred speech, having confusion.  EEG to evaluate for seizures.  Level of alertness: Awake,asleep  AEDs during EEG study: None  Technical aspects: This EEG study was done with scalp electrodes positioned according to the 10-20 International system of electrode placement. Electrical activity was acquired at a sampling rate of 500Hz  and reviewed with a high frequency filter of 70Hz  and a low frequency filter of 1Hz . EEG data were recorded continuously and digitally stored.   Description: The posterior dominant rhythm consists of 9-10 Hz activity of moderate voltage (25-35 uV) seen predominantly in posterior head regions, symmetric and reactive to eye opening and eye closing. Sleep was characterized by vertex waves, sleep spindles (12 to 14 Hz), maximal frontocentral region.   Physiologic photic driving was seen during photic stimulation.  Hyperventilation was not performed.     IMPRESSION: This study is within normal limits. No seizures or epileptiform discharges were seen throughout the recording.  Hao Dion 

## 2020-05-26 NOTE — Progress Notes (Signed)
Neurology Progress Note  Patient ID: Glenda Smith is a 49 y.o. with PMHx of  has a past medical history of Diabetes mellitus without complication ([place]), Hypertension, and Migraine. and mood disorder   Initially consulted for:  Intermittent episodes of confusion, slurred speech and new-onset choreiform movements  Major interval events:  - Psych eval completed, recommending d/c stimulants  Subjective: - Reports feeling generally a bit better - Feels her movements are slightly improved and states she's been fidgety her whole life - contemplative about stopping her stimulant   Exam: Vitals:   05/26/20 2010 05/27/20 0410  BP: 111/73 119/74  Pulse: 76 64  Resp:    Temp: 98.4 F (36.9 C) 98 F (36.7 C)  SpO2: 97% 99%   Gen: In bed, comfortable  Resp: non-labored breathing, no grossly audible wheezing Cardiac: Perfusing extremities well  Abd: soft, nt  Neuro: MS: Alert, awake, oriented to person/place/situation/day of the week, month and year CN: face symmetric, EOMI Motor: Ambulates easily, rises on toes and heels without issue, mildly unstable tandem gait patient attributes to being tired (6:40 AM). Continues to have intermittent dance-like movements involving face, eyebrows, and all four limbs, symmetrically  Pertinent Data: - RPR and HIV negative - TSH 9.362, T4 0.67 - B12 236 - AST elevated at 124 - Ammonia 21  - MMA, intrinsic factor antibodies pending - Thiamine level pending - [facility] Movement Disorder, Autoimmune Evaluation, Serum pending  11/4 EEG completed, normal   Impression: 49 y.o. with possibly provoked secondary to stimulant use.   Acquired causes of chorea include structural lesions (strokes, tumors, demyelinating lesions) metabolic/endocrine issues (nonketotic hyperglycemia, hypoglycemia, hypocalcemia, hypo or hypernatremia, uremia, acquired hepatic cerebral degeneration, hypothyroidism, hyperparathyroidism and hyperparathyroidism), infectious  etiologies (HIV, toxoplasmosis), drug-induced (levodopa, cocaine, amphetamines, anticonvulsants, lithium, anticholinergics, neuroleptic withdrawal), autoimmune/paraneoplastic process   Given broad differential for chorea, work-up is extensive.  Standard first tier testing includes complete blood count, thyroid function tests, liver function tests, serum electrolytes, serum calcium, ANA, anti-dsDNA, lupus anticoagulant, antiphospholipid antibody syndrome. In patients with a subacute temporal profile antibody testing in both serum and CSF; however, patient is declining CSF studies at this time as she does not like the idea of an LP. She is willing to consider this on an outpatient basis if she continues to decline; given she feels she is improving at this time, it can be deferred to the outpatient setting.   Imaging should include MRI brain with consideration of CT chest abdomen pelvis for malignancy screening.  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   Regarding the potential for genetic causes of chorea this should be evaluated on an outpatient basis in a specialized center; given the impact of diagnosis, genetic counseling prior to testing is standard of care.  Reference: "Chorea" Pichet Termsarasab, Movement Disorders p. 4142-3953 August 2019, Vol.25, No.4 doi: 10.1212/[email or web address]  [email or web address]  Recommendations: - ANA - Anti-phospholipid panel - Lupus anticoagulant panel - Anti-ds-DNA - Thyroglobulin antibody, thyroid stimulating immunoglobulin, thyroid peroxidase antibody,  - CRP, ESR - Copper, serum and urine; ceruloplasmin serum - CT chest/abdomen pelvis for malignancy screening; if positive for malignancy workup per primary team (could consider outpatient follow-up) -  Continue B12 as previously ordered - Follow-up other labs as above   [place] [phone]

## 2020-05-26 NOTE — Progress Notes (Signed)
Patient ID: Glenda Smith, female   DOB: 1971/02/26, 49 y.o.   MRN: [id number]  PROGRESS NOTE    JOELLYN GRANDT  EYC:[id number] DOB: 02-19-1971 DOA: 05/25/2020 PCP: Patient, No Pcp Per   Brief Narrative:  49 y.o. female with a past medical history of bipolar disorder, anxiety disorder who lives with her husband was brought in on 05/25/2020 with altered mental status. Patient was minimally responsive on presentation and could not provide any history. Husband reported that patient has had worsening balance issues over the last many weeks along with intermittent episodes of inability to sleep for 48 hours followed by sleeping for 24 to 48 hours and intermittent slurring of speech and rolling back of eyes. In the ED, ammonia was 21. CT of the head was unremarkable. MRI brain was negative for any acute process. Urine drug screen was positive for amphetamines and benzodiazepine. UA was suggestive of UTI. She was started on IV antibiotics. Neurology and psychiatry were consulted.  Assessment & Plan:  Acute encephalopathy -Questionable cause. Patient had extensive psychiatric history. Urinalysis alone does not explain her symptoms. -CT/MRI of the brain were negative for any acute abnormality. -Neurology following. Follow EEG and further work-up and recommendations as per neurology -Monitor mental status. Fall precautions. -Continue n.p.o. for now. Continue IV fluids.  Urinary tract infection -Follow cultures. Continue Rocephin  History of bipolar disorder -Follows up with psychiatrist/Dr. Evelene Croon. Home meds on hold. Follow psychiatry recommendations  Hypokalemia -Replace. Repeat a.m. labs  DVT prophylaxis: Lovenox Code Status: Full Family Communication: None at bedside Disposition Plan: Status is: Observation  The patient will require care spanning > 2 midnights and should be moved to inpatient because: Inpatient level of care appropriate due to severity of illness. Patient is still almost  unresponsive requiring IV fluids and further neurological/psychiatric input.  Dispo: The patient is from: Home              Anticipated d/c is to: Home              Anticipated d/c date is: 3 days              Patient currently is not medically stable to d/c.   Consultants: Neurology/psychiatry  Procedures: None  Antimicrobials: Rocephin from 05/25/2020 onwards   Subjective: Patient seen and examined at bedside. Wakes up only very slightly, hardly answers any questions. No overnight seizures, fever or vomiting reported. Objective: Vitals:   05/26/20 0059 05/26/20 0706 05/26/20 0802 05/26/20 1100  BP: 117/80 121/72 131/84 131/78  Pulse: 70 67 75 68  Resp: 18 15 16 16   Temp: 98.3 F (36.8 C) 98.4 F (36.9 C) 98 F (36.7 C) 98.6 F (37 C)  TempSrc: Oral Oral Oral Oral  SpO2: 99% 100% 97%   Weight: 66.5 kg     Height:        Intake/Output Summary (Last 24 hours) at 05/26/2020 1131 Last data filed at 05/26/2020 0900 Gross per 24 hour  Intake 484.17 ml  Output --  Net 484.17 ml   Filed Weights   05/25/20 1755 05/26/20 0059  Weight: 65.8 kg 66.5 kg    Examination:  General exam: No acute distress. Respiratory system: Bilateral decreased breath sounds at bases Cardiovascular system: S1 & S2 heard, Rate controlled Gastrointestinal system: Abdomen is nondistended, soft and nontender. Normal bowel sounds heard. Extremities: No cyanosis, clubbing, edema  Central nervous system: Sleepy, wakes up only very slightly, hardly answers any questions. No obvious focal neurological deficit noted  skin:  No rashes, lesions or ulcers Psychiatry: Could not be assessed because of mental status.     Data Reviewed: I have personally reviewed following labs and imaging studies  CBC: Recent Labs  Lab 05/25/20 1800 05/26/20 0520  WBC 7.3 4.7  NEUTROABS 5.6  --   HGB 14.3 11.7*  HCT 43.9 37.8  MCV 94.4 98.2  PLT 439* 329   Basic Metabolic Panel: Recent Labs  Lab 05/25/20 1800  05/26/20 0520  NA 134* 136  K 3.6 3.3*  CL 98 105  CO2 22 21*  GLUCOSE 100* 96  BUN 10 9  CREATININE 0.81 0.74  CALCIUM 9.3 8.3*   GFR: Estimated Creatinine Clearance: 76.2 mL/min (by C-G formula based on SCr of 0.74 mg/dL). Liver Function Tests: Recent Labs  Lab 05/25/20 1800 05/26/20 0520  AST 112* 124*  ALT 32 33  ALKPHOS 84 61  BILITOT 0.6 0.8  PROT 8.9* 6.7  ALBUMIN 4.5 3.4*   No results for input(s): LIPASE, AMYLASE in the last 168 hours. Recent Labs  Lab 05/25/20 1923  AMMONIA 21   Coagulation Profile: No results for input(s): INR, PROTIME in the last 168 hours. Cardiac Enzymes: No results for input(s): CKTOTAL, CKMB, CKMBINDEX, TROPONINI in the last 168 hours. BNP (last 3 results) No results for input(s): PROBNP in the last 8760 hours. HbA1C: No results for input(s): HGBA1C in the last 72 hours. CBG: Recent Labs  Lab 05/26/20 0108  GLUCAP 83   Lipid Profile: No results for input(s): CHOL, HDL, LDLCALC, TRIG, CHOLHDL, LDLDIRECT in the last 72 hours. Thyroid Function Tests: Recent Labs    05/26/20 0520 05/26/20 0521  TSH  --  9.362*  FREET4 0.67  --    Anemia Panel: Recent Labs    05/26/20 0521  VITAMINB12 236   Sepsis Labs: No results for input(s): PROCALCITON, LATICACIDVEN in the last 168 hours.  Recent Results (from the past 240 hour(s))  Respiratory Panel by RT PCR (Flu A&B, Covid) - Nasopharyngeal Swab     Status: None   Collection Time: 05/25/20  6:25 PM   Specimen: Nasopharyngeal Swab  Result Value Ref Range Status   SARS Coronavirus 2 by RT PCR NEGATIVE NEGATIVE Final    Comment: (NOTE) SARS-CoV-2 target nucleic acids are NOT DETECTED.  The SARS-CoV-2 RNA is generally detectable in upper respiratoy specimens during the acute phase of infection. The lowest concentration of SARS-CoV-2 viral copies this assay can detect is 131 copies/mL. A negative result does not preclude SARS-Cov-2 infection and should not be used as the sole  basis for treatment or other patient management decisions. A negative result may occur with  improper specimen collection/handling, submission of specimen other than nasopharyngeal swab, presence of viral mutation(s) within the areas targeted by this assay, and inadequate number of viral copies (<131 copies/mL). A negative result must be combined with clinical observations, patient history, and epidemiological information. The expected result is Negative.  Fact Sheet for Patients:  https://www.moore.com/https://www.fda.gov/media/142436/download  Fact Sheet for Healthcare Providers:  https://www.young.biz/https://www.fda.gov/media/142435/download  This test is no t yet approved or cleared by the [place] FDA and  has been authorized for detection and/or diagnosis of SARS-CoV-2 by FDA under an Emergency Use Authorization (EUA). This EUA will remain  in effect (meaning this test can be used) for the duration of the COVID-19 declaration under Section 564(b)(1) of the Act, 21 U.S.C. section 360bbb-3(b)(1), unless the authorization is terminated or revoked sooner.     Influenza A by PCR NEGATIVE NEGATIVE Final  Influenza B by PCR NEGATIVE NEGATIVE Final    Comment: (NOTE) The Xpert Xpress SARS-CoV-2/FLU/RSV assay is intended as an aid in  the diagnosis of influenza from Nasopharyngeal swab specimens and  should not be used as a sole basis for treatment. Nasal washings and  aspirates are unacceptable for Xpert Xpress SARS-CoV-2/FLU/RSV  testing.  Fact Sheet for Patients: https://www.moore.com/  Fact Sheet for Healthcare Providers: https://www.young.biz/  This test is not yet approved or cleared by the [place] FDA and  has been authorized for detection and/or diagnosis of SARS-CoV-2 by  FDA under an Emergency Use Authorization (EUA). This EUA will remain  in effect (meaning this test can be used) for the duration of the  Covid-19 declaration under Section 564(b)(1) of the  Act, 21  U.S.C. section 360bbb-3(b)(1), unless the authorization is  terminated or revoked. Performed at [facility], [address], [place], [place] [address]          Radiology Studies: CT Head Wo Contrast  Result Date: 05/25/2020 CLINICAL DATA:  Slurred speech, rambling, confusion. Last known normal at noon. Cerebral hemorrhage suspected. EXAM: CT HEAD WITHOUT CONTRAST TECHNIQUE: Contiguous axial images were obtained from the base of the skull through the vertex without intravenous contrast. COMPARISON:  None. FINDINGS: Brain: No evidence of large-territorial acute infarction. No parenchymal hemorrhage. No mass lesion. No extra-axial collection. No mass effect or midline shift. No hydrocephalus. Basilar cisterns are patent. Vascular: No hyperdense vessel. Skull: No acute fracture or focal lesion. Sinuses/Orbits: Paranasal sinuses and mastoid air cells are clear. The orbits are unremarkable. Other: None. IMPRESSION: No acute intracranial abnormality. If high clinical suspicion for acute stroke, please consider MR brain noncontrast for further/mores sensitive evaluation. Electronically Signed   By: Tish Frederickson M.D.   On: 05/25/2020 19:08   MR Brain Wo Contrast (neuro protocol)  Result Date: 05/25/2020 CLINICAL DATA:  Initial evaluation for acute slurred speech, confusion. EXAM: MRI HEAD WITHOUT CONTRAST TECHNIQUE: Multiplanar, multiecho pulse sequences of the brain and surrounding structures were obtained without intravenous contrast. COMPARISON:  Prior CT from earlier same day. FINDINGS: Brain: Cerebral volume within normal limits for patient age. Single subcentimeter focus of FLAIR hyperintensity noted within the white matter of the right frontal corona radiata, nonspecific, but of doubtful significance in isolation. No other focal parenchymal signal abnormality. No abnormal foci of restricted diffusion to suggest acute or subacute ischemia. Gray-white matter  differentiation well maintained. No encephalomalacia to suggest chronic infarction. No foci of susceptibility artifact to suggest acute or chronic intracranial hemorrhage. No mass lesion, midline shift or mass effect. No hydrocephalus. No extra-axial fluid collection. Major dural sinuses are grossly patent. Pituitary gland and suprasellar region are normal. Midline structures intact and normal. Vascular: Major intracranial vascular flow voids well maintained and normal in appearance. Skull and upper cervical spine: Craniocervical junction normal. Visualized upper cervical spine within normal limits. Bone marrow signal intensity normal. No scalp soft tissue abnormality. Sinuses/Orbits: Globes and orbital soft tissues within normal limits. Minimal mucosal thickening noted within the ethmoidal air cells. Paranasal sinuses are otherwise clear. No mastoid effusion. Inner ear structures grossly normal. Other: None. IMPRESSION: Normal brain MRI. No acute intracranial abnormality identified. Electronically Signed   By: Rise Mu M.D.   On: 05/25/2020 22:56        Scheduled Meds:  cyanocobalamin  1,000 mcg Intramuscular Daily   Followed by   Melene Muller ON 06/02/2020] vitamin B-12  1,000 mcg Oral Daily   enoxaparin (LOVENOX) injection  40 mg  Subcutaneous Q24H   thiamine injection  100 mg Intravenous Daily   Continuous Infusions:  sodium chloride 75 mL/hr at 05/25/20 2350   cefTRIAXone (ROCEPHIN)  IV 1 g (05/25/20 2351)          Glade Lloyd, MD [facility] 05/26/2020, 11:31 AM

## 2020-05-26 NOTE — Consult Note (Signed)
[facility] Face-to-Face Psychiatry Consult   Reason for Consult: Altered mental status, history of bipolar Referring Physician:  DR. Hanley Ben Patient Identification: Glenda Smith MRN:  [id number] Principal Diagnosis: <principal problem not specified> Diagnosis:  Active Problems:   AMS (altered mental status)   Altered mental status, unspecified   Total Time spent with patient: 30 minutes  Subjective:   Glenda Smith is a 49 y.o. female patient admitted with altered mental status, and intermittent episodes of confusion and slurred speech.  Patient is seen and case discussed with husband who is at the bedside.  Patient is alert and oriented, calm and cooperative, and very pleasant during the evaluation.  She is observed to be very restless yet agreeing to participate in evaluation.  She also provides verbal consent to speak with her husband who also is at the bedside.  Patient states she is noticed a recent decline in health over the past 2 years in which she was diagnosed with bipolar disorder, and has been having outpatient mental health services under the care of Dr. Enzo Montgomery.  Reports taking Adderall 20 mg p.o. daily for the past 2 years, and Xanax 0.5 mg as needed.  Patient denies any previous inpatient history.  She also denies any recent or current suicidal thoughts.  She denies any past history for suicidal thoughts and or self-harm behaviors.  She denies any auditory or visual hallucinations, psychosis.  She does not appear to be responding to internal stimuli.  HPI:  Glenda Smith is an 49 y.o. female with a PMHx of DM, HTN and migraine, who presented to the [facility] ED with intermittent episodes of confusion and slurred speech. Husband states that these last for several minutes and then resolve spontaneously. She has had cognitive and memory problems with gradual worsening over the past 2 years, per husband. Also was diagnosed with bipolar disorder in the past 2 years. She had no headache on  presenting, but now complains of a 6/10 headache. She has some discomfort behind her eyes but this is not painful. No vision loss, but she has been experiencing abnormal upward eye movements recently. In addition, she has had writhing movements of her body today, with some such movements intermittently over the past 3 months, per husband. No family history of chorea. No seizure like episodes. She has no fever or neck stiffness. Also with no recent medication changes. Husband denies any illicit substance or alcohol abuse.   Past Psychiatric History: Diagnosis of bipolar disorder.  Currently being managed byDr. Enzo Montgomery.  She has taken Adderall 20 mg p.o. daily for the past 2 years for attention, concentration, and focus.  She also reports taking Xanax 0.5 mg as needed for anxiety.  She is currently not prescribed any medication for her mood disorder.  She denies any previous suicide attempts and or self-harm behaviors.  Risk to Self:  Denies Risk to Others:  Denies Prior Inpatient Therapy:  Denies Prior Outpatient Therapy:  See above  Past Medical History:  Past Medical History:  Diagnosis Date  . Diabetes mellitus without complication (HCC)   . Hypertension   . Migraine    History reviewed. No pertinent surgical history. Family History: History reviewed. No pertinent family history. Family Psychiatric  History: Denies Social History:  Social History   Substance and Sexual Activity  Alcohol Use None     Social History   Substance and Sexual Activity  Drug Use Not on file    Social History   Socioeconomic History  .  Marital status: Single    Spouse name: Not on file  . Number of children: Not on file  . Years of education: Not on file  . Highest education level: Not on file  Occupational History  . Not on file  Tobacco Use  . Smoking status: Former Games developer  . Smokeless tobacco: Never Used  Substance and Sexual Activity  . Alcohol use: Not on file  . Drug use: Not on  file  . Sexual activity: Not on file  Other Topics Concern  . Not on file  Social History Narrative  . Not on file   Social Determinants of Health   Financial Resource Strain:   . Difficulty of Paying Living Expenses: Not on file  Food Insecurity:   . Worried About Programme researcher, broadcasting/film/video in the Last Year: Not on file  . Ran Out of Food in the Last Year: Not on file  Transportation Needs:   . Lack of Transportation (Medical): Not on file  . Lack of Transportation (Non-Medical): Not on file  Physical Activity:   . Days of Exercise per Week: Not on file  . Minutes of Exercise per Session: Not on file  Stress:   . Feeling of Stress : Not on file  Social Connections:   . Frequency of Communication with Friends and Family: Not on file  . Frequency of Social Gatherings with Friends and Family: Not on file  . Attends Religious Services: Not on file  . Active Member of Clubs or Organizations: Not on file  . Attends Banker Meetings: Not on file  . Marital Status: Not on file   Additional Social History:    Allergies:  No Known Allergies  Labs:  Results for orders placed or performed during the hospital encounter of 05/25/20 (from the past 48 hour(s))  CBC with Differential/Platelet     Status: Abnormal   Collection Time: 05/25/20  6:00 PM  Result Value Ref Range   WBC 7.3 4.0 - 10.5 K/uL   RBC 4.65 3.87 - 5.11 MIL/uL   Hemoglobin 14.3 12.0 - 15.0 g/dL   HCT 51.0 36 - 46 %   MCV 94.4 80.0 - 100.0 fL   MCH 30.8 26.0 - 34.0 pg   MCHC 32.6 30.0 - 36.0 g/dL   RDW 25.8 52.7 - 78.2 %   Platelets 439 (H) 150 - 400 K/uL   nRBC 0.0 0.0 - 0.2 %   Neutrophils Relative % 77 %   Neutro Abs 5.6 1.7 - 7.7 K/uL   Lymphocytes Relative 17 %   Lymphs Abs 1.2 0.7 - 4.0 K/uL   Monocytes Relative 6 %   Monocytes Absolute 0.5 0.1 - 1.0 K/uL   Eosinophils Relative 0 %   Eosinophils Absolute 0.0 0.0 - 0.5 K/uL   Basophils Relative 0 %   Basophils Absolute 0.0 0.0 - 0.1 K/uL    Immature Granulocytes 0 %   Abs Immature Granulocytes 0.02 0.00 - 0.07 K/uL    Comment: Performed at [facility], [address], [place], [place] [address]  Comprehensive metabolic panel     Status: Abnormal   Collection Time: 05/25/20  6:00 PM  Result Value Ref Range   Sodium 134 (L) 135 - 145 mmol/L   Potassium 3.6 3.5 - 5.1 mmol/L   Chloride 98 98 - 111 mmol/L   CO2 22 22 - 32 mmol/L   Glucose, Bld 100 (H) 70 - 99 mg/dL    Comment: Glucose reference range  applies only to samples taken after fasting for at least 8 hours.   BUN 10 6 - 20 mg/dL   Creatinine, Ser 1.61 0.44 - 1.00 mg/dL   Calcium 9.3 8.9 - 09.6 mg/dL   Total Protein 8.9 (H) 6.5 - 8.1 g/dL   Albumin 4.5 3.5 - 5.0 g/dL   AST 045 (H) 15 - 41 U/L   ALT 32 0 - 44 U/L   Alkaline Phosphatase 84 38 - 126 U/L   Total Bilirubin 0.6 0.3 - 1.2 mg/dL   GFR, Estimated >40 >98 mL/min    Comment: (NOTE) Calculated using the CKD-EPI Creatinine Equation (2021)    Anion gap 14 5 - 15    Comment: Performed at [facility], [address], [place], [place] [address]  Ethanol     Status: None   Collection Time: 05/25/20  6:00 PM  Result Value Ref Range   Alcohol, Ethyl (B) <10 <10 mg/dL    Comment: (NOTE) Lowest detectable limit for serum alcohol is 10 mg/dL.  For medical purposes only. Performed at [facility], [address] [place], [place] [address]   Respiratory Panel by RT PCR (Flu A&B, Covid) - Nasopharyngeal Swab     Status: None   Collection Time: 05/25/20  6:25 PM   Specimen: Nasopharyngeal Swab  Result Value Ref Range   SARS Coronavirus 2 by RT PCR NEGATIVE NEGATIVE    Comment: (NOTE) SARS-CoV-2 target nucleic acids are NOT DETECTED.  The SARS-CoV-2 RNA is generally detectable in upper respiratoy specimens during the acute phase of infection. The lowest concentration of SARS-CoV-2 viral copies this assay can detect is 131 copies/mL. A negative result  does not preclude SARS-Cov-2 infection and should not be used as the sole basis for treatment or other patient management decisions. A negative result may occur with  improper specimen collection/handling, submission of specimen other than nasopharyngeal swab, presence of viral mutation(s) within the areas targeted by this assay, and inadequate number of viral copies (<131 copies/mL). A negative result must be combined with clinical observations, patient history, and epidemiological information. The expected result is Negative.  Fact Sheet for Patients:  https://www.moore.com/  Fact Sheet for Healthcare Providers:  https://www.young.biz/  This test is no t yet approved or cleared by the [place] FDA and  has been authorized for detection and/or diagnosis of SARS-CoV-2 by FDA under an Emergency Use Authorization (EUA). This EUA will remain  in effect (meaning this test can be used) for the duration of the COVID-19 declaration under Section 564(b)(1) of the Act, 21 U.S.C. section 360bbb-3(b)(1), unless the authorization is terminated or revoked sooner.     Influenza A by PCR NEGATIVE NEGATIVE   Influenza B by PCR NEGATIVE NEGATIVE    Comment: (NOTE) The Xpert Xpress SARS-CoV-2/FLU/RSV assay is intended as an aid in  the diagnosis of influenza from Nasopharyngeal swab specimens and  should not be used as a sole basis for treatment. Nasal washings and  aspirates are unacceptable for Xpert Xpress SARS-CoV-2/FLU/RSV  testing.  Fact Sheet for Patients: https://www.moore.com/  Fact Sheet for Healthcare Providers: https://www.young.biz/  This test is not yet approved or cleared by the [place] FDA and  has been authorized for detection and/or diagnosis of SARS-CoV-2 by  FDA under an Emergency Use Authorization (EUA). This EUA will remain  in effect (meaning this test can be used) for the duration  of the  Covid-19 declaration under Section 564(b)(1) of the Act, 21  U.S.C. section  360bbb-3(b)(1), unless the authorization is  terminated or revoked. Performed at [facility], [address] [place], [place] [address]   Ammonia     Status: None   Collection Time: 05/25/20  7:23 PM  Result Value Ref Range   Ammonia 21 9 - 35 umol/L    Comment: Performed at [facility], [address] [place], [place] [address]  Rapid urine drug screen (hospital performed)     Status: Abnormal   Collection Time: 05/25/20 10:00 PM  Result Value Ref Range   Opiates NONE DETECTED NONE DETECTED   Cocaine NONE DETECTED NONE DETECTED   Benzodiazepines POSITIVE (A) NONE DETECTED   Amphetamines POSITIVE (A) NONE DETECTED   Tetrahydrocannabinol NONE DETECTED NONE DETECTED   Barbiturates NONE DETECTED NONE DETECTED    Comment: (NOTE) DRUG SCREEN FOR MEDICAL PURPOSES ONLY.  IF CONFIRMATION IS NEEDED FOR ANY PURPOSE, NOTIFY LAB WITHIN 5 DAYS.  LOWEST DETECTABLE LIMITS FOR URINE DRUG SCREEN Drug Class                     Cutoff (ng/mL) Amphetamine and metabolites    1000 Barbiturate and metabolites    200 Benzodiazepine                 200 Tricyclics and metabolites     300 Opiates and metabolites        300 Cocaine and metabolites        300 THC                            50 Performed at [facility], [address] [place], [place] [address]   Urinalysis, Routine w reflex microscopic Urine, Clean Catch     Status: Abnormal   Collection Time: 05/25/20 10:00 PM  Result Value Ref Range   Color, Urine YELLOW YELLOW   APPearance CLOUDY (A) CLEAR   Specific Gravity, Urine 1.009 1.005 - 1.030   pH 6.0 5.0 - 8.0   Glucose, UA NEGATIVE NEGATIVE mg/dL   Hgb urine dipstick MODERATE (A) NEGATIVE   Bilirubin Urine NEGATIVE NEGATIVE   Ketones, ur NEGATIVE NEGATIVE mg/dL   Protein, ur NEGATIVE NEGATIVE mg/dL   Nitrite POSITIVE (A) NEGATIVE    Leukocytes,Ua LARGE (A) NEGATIVE   RBC / HPF 6-10 0 - 5 RBC/hpf   WBC, UA >50 (H) 0 - 5 WBC/hpf   Bacteria, UA MANY (A) NONE SEEN   Squamous Epithelial / LPF 0-5 0 - 5   Mucus PRESENT     Comment: Performed at [facility], [address] [place], [place] [address]  Glucose, capillary     Status: None   Collection Time: 05/26/20  1:08 AM  Result Value Ref Range   Glucose-Capillary 83 70 - 99 mg/dL    Comment: Glucose reference range applies only to samples taken after fasting for at least 8 hours.   Comment 1 Notify RN    Comment 2 Document in Chart   Hepatitis panel, acute     Status: None (Preliminary result)   Collection Time: 05/26/20  5:20 AM  Result Value Ref Range   Hepatitis B Surface Ag NON REACTIVE NON REACTIVE   HCV Ab NON REACTIVE NON REACTIVE    Comment: (NOTE) Nonreactive HCV antibody screen is consistent with no HCV infections,  unless recent infection is suspected or other evidence exists to indicate HCV infection.  Performed at [facility], [address], [place], [place] [address]    Hep A IgM PENDING NON REACTIVE   Hep B C IgM PENDING NON REACTIVE  HIV Antibody (routine testing w rflx)     Status: None   Collection Time: 05/26/20  5:20 AM  Result Value Ref Range   HIV Screen 4th Generation wRfx Non Reactive Non Reactive    Comment: Performed at [facility], [address], [place], [place] [address]  RPR     Status: None   Collection Time: 05/26/20  5:20 AM  Result Value Ref Range   RPR Ser Ql NON REACTIVE NON REACTIVE    Comment: Performed at [facility], [address] [place], [place] [address]  Comprehensive metabolic panel     Status: Abnormal   Collection Time: 05/26/20  5:20 AM  Result Value Ref Range   Sodium 136 135 - 145 mmol/L   Potassium 3.3 (L) 3.5 - 5.1 mmol/L   Chloride 105 98 - 111 mmol/L   CO2 21 (L) 22 - 32 mmol/L   Glucose, Bld 96 70 - 99 mg/dL    Comment: Glucose reference range applies  only to samples taken after fasting for at least 8 hours.   BUN 9 6 - 20 mg/dL   Creatinine, Ser 2.44 0.44 - 1.00 mg/dL   Calcium 8.3 (L) 8.9 - 10.3 mg/dL   Total Protein 6.7 6.5 - 8.1 g/dL   Albumin 3.4 (L) 3.5 - 5.0 g/dL   AST 010 (H) 15 - 41 U/L   ALT 33 0 - 44 U/L   Alkaline Phosphatase 61 38 - 126 U/L   Total Bilirubin 0.8 0.3 - 1.2 mg/dL   GFR, Estimated >27 >25 mL/min    Comment: (NOTE) Calculated using the CKD-EPI Creatinine Equation (2021)    Anion gap 10 5 - 15    Comment: Performed at [facility], [address] [place], [place] [address]  CBC     Status: Abnormal   Collection Time: 05/26/20  5:20 AM  Result Value Ref Range   WBC 4.7 4.0 - 10.5 K/uL   RBC 3.85 (L) 3.87 - 5.11 MIL/uL   Hemoglobin 11.7 (L) 12.0 - 15.0 g/dL   HCT 03.4 36 - 46 %   MCV 98.2 80.0 - 100.0 fL   MCH 30.4 26.0 - 34.0 pg   MCHC 31.0 30.0 - 36.0 g/dL   RDW 74.2 59.5 - 63.8 %   Platelets 329 150 - 400 K/uL   nRBC 0.0 0.0 - 0.2 %    Comment: Performed at [facility], [address] [place], [place] [address]  T4, free     Status: None   Collection Time: 05/26/20  5:20 AM  Result Value Ref Range   Free T4 0.67 0.61 - 1.12 ng/dL    Comment: (NOTE) Biotin ingestion may interfere with free T4 tests. If the results are inconsistent with the TSH level, previous test results, or the clinical presentation, then consider biotin interference. If needed, order repeat testing after stopping biotin. Performed at [facility], [address] [place], [place] [address]   TSH     Status: Abnormal   Collection Time: 05/26/20  5:21 AM  Result Value Ref Range   TSH 9.362 (H) 0.350 - 4.500 uIU/mL    Comment: Performed by a 3rd Generation assay with a functional sensitivity of <=0.01 uIU/mL. Performed at [facility], [address], [place], [place] [address]   Vitamin B12     Status: None   Collection Time: 05/26/20  5:21 AM  Result  Value Ref Range   Vitamin B-12 236 180 - 914 pg/mL    Comment: (NOTE) This assay is not validated for testing neonatal or myeloproliferative syndrome specimens for Vitamin B12 levels. Performed at [facility], [address] [place], [place] [address]     Current Facility-Administered Medications  Medication Dose Route Frequency Provider Last Rate Last Admin  . 0.9 %  sodium chloride infusion   Intravenous Continuous Osvaldo Shipper, MD 75 mL/hr at 05/25/20 2350 Rate Change at 05/25/20 2350  . acetaminophen (TYLENOL) tablet 650 mg  650 mg Oral Q6H PRN Osvaldo Shipper, MD       Or  . acetaminophen (TYLENOL) suppository 650 mg  650 mg Rectal Q6H PRN Osvaldo Shipper, MD      . cefTRIAXone (ROCEPHIN) 1 g in sodium chloride 0.9 % 100 mL IVPB  1 g Intravenous Q24H Osvaldo Shipper, MD 200 mL/hr at 05/25/20 2351 1 g at 05/25/20 2351  . cyanocobalamin ((VITAMIN B-12)) injection 1,000 mcg  1,000 mcg Intramuscular Daily Bhagat, Srishti L, MD       Followed by  . [START ON 06/02/2020] vitamin B-12 (CYANOCOBALAMIN) tablet 1,000 mcg  1,000 mcg Oral Daily Bhagat, Srishti L, MD      . enoxaparin (LOVENOX) injection 40 mg  40 mg Subcutaneous Q24H Osvaldo Shipper, MD   40 mg at 05/25/20 2354  . ondansetron (ZOFRAN) tablet 4 mg  4 mg Oral Q6H PRN Osvaldo Shipper, MD       Or  . ondansetron [facility]) injection 4 mg  4 mg Intravenous Q6H PRN Osvaldo Shipper, MD      . potassium chloride 10 mEq in 100 mL IVPB  10 mEq Intravenous Q1 Hr x 3 Marikay Alar, FNP 100 mL/hr at 05/26/20 1131 10 mEq at 05/26/20 1131  . thiamine (B-1) injection 100 mg  100 mg Intravenous Daily Osvaldo Shipper, MD   100 mg at 05/26/20 1128    Musculoskeletal: Strength & Muscle Tone: abnormal Gait & Station: normal Patient leans: N/A  Psychiatric Specialty Exam: Physical Exam  Review of Systems  Blood pressure 131/78, pulse 68, temperature 98.6 F (37 C), temperature source Oral, resp. rate 16, height 5\' 3"   (1.6 m), weight 66.5 kg, SpO2 97 %.Body mass index is 25.97 kg/m.  General Appearance: Fairly Groomed  Eye Contact:  Fair  Speech:  Clear and Coherent and Slow  Volume:  Normal  Mood:  Doing ok  Affect:  Congruent  Thought Process:  Coherent, Linear and Descriptions of Associations: Intact  Orientation:  Full (Time, Place, and Person)  Thought Content:  Logical  Suicidal Thoughts:  No  Homicidal Thoughts:  No  Memory:  Immediate;   Fair Recent;   Fair  Judgement:  Fair  Insight:  Fair  Psychomotor Activity:  Normal  Concentration:  Concentration: Good and Attention Span: Good  Recall:  Good  Fund of Knowledge:  Good  Language:  Good  Akathisia:  Negative  Handed:  Right  AIMS (if indicated):     Assets:  Communication Skills Desire for Improvement Financial Resources/Insurance Housing Leisure Time Physical Health Resilience Social Support  ADL's:  Intact  Cognition:  WNL  Sleep:      49 year old female with a 2-year history of progressive memory problems and confusion who presented to the emergency room.  Her husband states patient has  noticed an increase in falls specifically fallen twice this week alone, being able to form complete sentences.  He states these  "Only lasts 15 to 20 minutes and she is perfectly fine after that."  He also expressed concerns about her inability to sleep and being up for 24 to 48 hours at a time.  Patient and husband agree that symptoms started around the same time she began taking the medications to include Adderall.  Patient appeared hesitant to discontinue Adderall use at this time despite possible correlation to current neurologic condition.  PDMP review verifies patient has been receiving Adderall 20 mg #90, and alprazolam 0.5 mg #120.  Patient admits that she takes alprazolam 0.5 as needed, yet she is being prescribed this medication up to 4 times a day.  She denies any suicidal ideations, and or homicidal ideations, or hallucinations.  Her  periods of confusion, nonsensical speech, and increased falls do not appear to be psychiatric related.  Psychiatry to sign off at this time.  However did discuss with patient discontinuation of amphetamines as this may be exacerbating some of her neurological symptoms, and can cause worsening irritability, depressive symptoms, and mania.  Treatment Plan Summary: Plan patient diagnosed with bipolar disorder, is currently taking Adderall 20 mg and alprazolam 0.5 as needed.  Will recommend discontinuing  Adderall despite patient's being in disagreement.  Some stimulants can cause increase and mood disorders, and this was discussed with patient.  At this time she denies any symptoms of bipolar disorder or depression.  Patient is currently undergoing extensive work-up by neurology to include Huntington's disease. Follow up with outpatient psychiatry after discharge.   Disposition: No evidence of imminent risk to self or others at present.   Patient does not meet criteria for psychiatric inpatient admission.  Maryagnes Amos, FNP 05/26/2020 12:05 PM

## 2020-05-26 NOTE — Progress Notes (Signed)
Pt admitted to [id number]. Pt is A&Ox2 and is very drowsy  but is easily reoriented, ambulates with no difficulty and reports no numbness or loss of function in peripheral extremities. Nystagmus in bilateral eyes. Pt has been educated on fall risk prevention strategies and has been given call bell with instructions on how to use it. Will continue to monitor and perform routine neuro checks.

## 2020-05-26 NOTE — Progress Notes (Signed)
EEG complete - results pending 

## 2020-05-27 ENCOUNTER — Inpatient Hospital Stay (HOSPITAL_COMMUNITY): Payer: Self-pay

## 2020-05-27 DIAGNOSIS — D519 Vitamin B12 deficiency anemia, unspecified: Secondary | ICD-10-CM

## 2020-05-27 LAB — URINE CULTURE: Culture: >=100000 — AB

## 2020-05-27 LAB — MISC LABCORP TEST (SEND OUT): Labcorp test code: 9985

## 2020-05-27 LAB — COMPREHENSIVE METABOLIC PANEL
ALT: 32 U/L (ref 0–44)
AST: 98 U/L — ABNORMAL HIGH (ref 15–41)
Albumin: 3.3 g/dL — ABNORMAL LOW (ref 3.5–5.0)
Alkaline Phosphatase: 56 U/L (ref 38–126)
Anion gap: 8 (ref 5–15)
BUN: 12 mg/dL (ref 6–20)
CO2: 21 mmol/L — ABNORMAL LOW (ref 22–32)
Calcium: 8.3 mg/dL — ABNORMAL LOW (ref 8.9–10.3)
Chloride: 108 mmol/L (ref 98–111)
Creatinine, Ser: 0.63 mg/dL (ref 0.44–1.00)
GFR, Estimated: >60 mL/min (ref >60)
Glucose, Bld: 89 mg/dL (ref 70–99)
Potassium: 3.6 mmol/L (ref 3.5–5.1)
Sodium: 137 mmol/L (ref 135–145)
Total Bilirubin: 0.6 mg/dL (ref 0.3–1.2)
Total Protein: 6.5 g/dL (ref 6.5–8.1)

## 2020-05-27 LAB — CBC WITH DIFFERENTIAL/PLATELET
Abs Immature Granulocytes: 0.01 10*3/uL (ref 0.00–0.07)
Basophils Absolute: 0 10*3/uL (ref 0.0–0.1)
Basophils Relative: 1 %
Eosinophils Absolute: 0.1 10*3/uL (ref 0.0–0.5)
Eosinophils Relative: 3 %
HCT: 35.7 % — ABNORMAL LOW (ref 36.0–46.0)
Hemoglobin: 11.4 g/dL — ABNORMAL LOW (ref 12.0–15.0)
Immature Granulocytes: 0 %
Lymphocytes Relative: 38 %
Lymphs Abs: 1.2 10*3/uL (ref 0.7–4.0)
MCH: 30.3 pg (ref 26.0–34.0)
MCHC: 31.9 g/dL (ref 30.0–36.0)
MCV: 94.9 fL (ref 80.0–100.0)
Monocytes Absolute: 0.4 10*3/uL (ref 0.1–1.0)
Monocytes Relative: 11 %
Neutro Abs: 1.5 10*3/uL — ABNORMAL LOW (ref 1.7–7.7)
Neutrophils Relative %: 47 %
Platelets: 299 10*3/uL (ref 150–400)
RBC: 3.76 MIL/uL — ABNORMAL LOW (ref 3.87–5.11)
RDW: 12.6 % (ref 11.5–15.5)
WBC: 3.2 10*3/uL — ABNORMAL LOW (ref 4.0–10.5)
nRBC: 0 % (ref 0.0–0.2)

## 2020-05-27 LAB — MAGNESIUM: Magnesium: 2.2 mg/dL (ref 1.7–2.4)

## 2020-05-27 LAB — C-REACTIVE PROTEIN: CRP: 1.8 mg/dL — ABNORMAL HIGH (ref <1.0)

## 2020-05-27 LAB — FOLATE: Folate: 8.2 ng/mL (ref >5.9)

## 2020-05-27 LAB — INTRINSIC FACTOR ANTIBODIES: Intrinsic Factor: 1.1 AU/mL (ref 0.0–1.1)

## 2020-05-27 LAB — SEDIMENTATION RATE: Sed Rate: 45 mm/hr — ABNORMAL HIGH (ref 0–22)

## 2020-05-27 LAB — AMMONIA: Ammonia: 21 umol/L (ref 9–35)

## 2020-05-27 MED ORDER — IOHEXOL 9 MG/ML PO SOLN
500.0000 mL | ORAL | Status: AC
  Administered 2020-05-27: 500 mL via ORAL

## 2020-05-27 MED ORDER — IOHEXOL 300 MG/ML  SOLN
100.0000 mL | Freq: Once | INTRAMUSCULAR | Status: AC | PRN
  Administered 2020-05-27: 100 mL via INTRAVENOUS

## 2020-05-27 MED ORDER — IOHEXOL 9 MG/ML PO SOLN
ORAL | Status: AC
  Administered 2020-05-27: 500 mL via ORAL
  Filled 2020-05-27: qty 1000

## 2020-05-27 MED ORDER — CYANOCOBALAMIN 1000 MCG PO TABS: 1000.0000 ug | ORAL_TABLET | Freq: Every day | ORAL | 0 refills | Status: AC

## 2020-05-27 NOTE — Progress Notes (Signed)
Discharge instructions reviewed in detail with patient and spouse.  All questions answered.  Patient verbalizes understanding regarding changes in medications and follow up instructions.  Patient has all belongings with her.  The room was double checked to make sure nothing was left behind.  Patient d/c via wheelchair in stable condition.

## 2020-05-27 NOTE — Plan of Care (Signed)
Patient is being d/c'd to home with husband.

## 2020-05-27 NOTE — TOC Transition Note (Signed)
Transition of Care [facility] - CM/SW Discharge Note   Patient Details  Name: Glenda Smith MRN: [id number] Date of Birth: Jun 25, 1971  [facility] CM/SW Contact:  Lanier Clam, RN Phone Number: 05/27/2020, 12:06 PM   Clinical Narrative: Noted Psych-will f/u otpt psych service.No CM needs.            Patient Goals and CMS Choice        Discharge Placement                       Discharge Plan and Services                                     Social Determinants of Health (SDOH) Interventions     Readmission Risk Interventions No flowsheet data found.

## 2020-05-27 NOTE — Discharge Summary (Signed)
Physician Discharge Summary  Glenda Smith ZYY:[id number] DOB: May 26, 1971 DOA: 05/25/2020  PCP: Patient, No Pcp Per  Admit date: 05/25/2020 Discharge date: 05/27/2020  Admitted From: Home Disposition: Home  Recommendations for Outpatient Follow-up:  1. Follow up with PCP in 1 week  2. Outpatient follow-up with neurology and psychiatry 3. Follow up in ED if symptoms worsen or new appear   Home Health: No Equipment/Devices: None  Discharge Condition: Stable CODE STATUS: Full Diet recommendation: Regular  Brief/Interim Summary: 49 y.o.femalewith a past medical history of bipolar disorder, anxiety disorder who lives with her husband was brought in on 05/25/2020 with altered mental status. Patient was minimally responsive on presentation and could not provide any history. Husband reported that patient has had worsening balance issues over the last many weeks along with intermittent episodes of inability to sleep for 48 hours followed by sleeping for 24 to 48 hours and intermittent slurring of speech and rolling back of eyes. In the ED, ammonia was 21. CT of the head was unremarkable. MRI brain was negative for any acute process. Urine drug screen was positive for amphetamines and benzodiazepine. UA was suggestive of UTI. She was started on IV antibiotics. Neurology and psychiatry were consulted.  During the hospitalization, her mental status gradually improved.  EEG was negative for seizures.  EEG is she has been started on supplemental cyanocobalamin.  Psychiatry recommended to DC Adderall and outpatient follow-up with psychiatry.  Neurology has cleared the patient for discharge with outpatient follow-up with neurology.  Discharge patient home today.  Discharge Diagnoses:   Acute encephalopathy -Questionable cause. Patient had extensive psychiatric history. Urinalysis alone does not explain her symptoms. -CT/MRI of the brain were negative for any acute abnormality. -Neurology following.  EEG  was negative for seizures. -Neurology has sent out a bunch of tests which are pending.   Neurology has cleared the patient for discharge with outpatient follow-up with neurology.  Neurology has recommended CT of the chest, abdomen and pelvis for malignancy screening and this can be followed up as an outpatient. -She is much more awake and tolerating diet.  Ambulating well. -Discharge patient home today after the CT is done.  Possible B12 deficiency -B12 level on the lower side of normal at 236.  Started on parenteral supplementation in the hospital.  Discharged home on oral supplementation.  Urinary tract infection -Cultures negative so far.  Today is day #3 of IV Rocephin.  No need for any more antibiotics on discharge.  History of bipolar disorder -Follows up with psychiatrist/Dr. Evelene Croon.   Psychiatry evaluation appreciated.  Continue as needed Xanax but hold Adderall.  No need for psychiatric hospitalization.  Outpatient follow-up with psychiatry.  Hypokalemia -Replaced.  Improved  Discharge Instructions  Discharge Instructions    Ambulatory referral to Neurology   Complete by: As directed    An appointment is requested in approximately: 4 weeks   Ambulatory referral to Neurology   Complete by: As directed    An appointment is requested in approximately: hospital followup   Diet general   Complete by: As directed    Increase activity slowly   Complete by: As directed      Allergies as of 05/27/2020   No Known Allergies     Medication List    STOP taking these medications   amphetamine-dextroamphetamine 20 MG tablet Commonly known as: ADDERALL     TAKE these medications   ALPRAZolam 0.5 MG tablet Commonly known as: XANAX Take 0.5 mg by mouth 4 (four) times  daily as needed for anxiety or sleep.   cholecalciferol 25 MCG (1000 UNIT) tablet Commonly known as: VITAMIN D3 Take 1,000 Units by mouth daily.   cyanocobalamin 1000 MCG tablet Take 1 tablet (1,000 mcg total)  by mouth daily. Start taking on: June 02, 2020   ibuprofen 200 MG tablet Commonly known as: ADVIL Take 400 mg by mouth every 6 (six) hours as needed for moderate pain.       Follow-up Information    PCP. Schedule an appointment as soon as possible for a visit in 1 week(s).        Psychiatrist. Schedule an appointment as soon as possible for a visit in 1 week(s).              No Known Allergies  Consultations:  Neurology/psychiatry   Procedures/Studies: EEG  Result Date: 05/26/2020 Charlsie Quest, MD     05/26/2020  1:51 PM Patient Name: Glenda Smith MRN: [id number] Epilepsy Attending: Charlsie Quest Referring Physician/Provider: Dr Osvaldo Shipper Date: 05/26/2020 Duration: 23.54 mins Patient history: 49 year old female with 2-year history of progressive memory problems and confusion, 75-month history of intermittent choreiform movements who presented after an episode of slurred speech, having confusion.  EEG to evaluate for seizures. Level of alertness: Awake,asleep AEDs during EEG study: None Technical aspects: This EEG study was done with scalp electrodes positioned according to the 10-20 International system of electrode placement. Electrical activity was acquired at a sampling rate of  and reviewed with a high frequency filter of  and a low frequency filter of . EEG data were recorded continuously and digitally stored. Description: The posterior dominant rhythm consists of 9-10 Hz activity of moderate voltage (25-35 uV) seen predominantly in posterior head regions, symmetric and reactive to eye opening and eye closing. Sleep was characterized by vertex waves, sleep spindles (12 to 14 Hz), maximal frontocentral region.   Physiologic photic driving was seen during photic stimulation.  Hyperventilation was not performed.   IMPRESSION: This study is within normal limits. No seizures or epileptiform discharges were seen throughout the recording. Charlsie Quest   CT  Head Wo Contrast  Result Date: 05/25/2020 CLINICAL DATA:  Slurred speech, rambling, confusion. Last known normal at noon. Cerebral hemorrhage suspected. EXAM: CT HEAD WITHOUT CONTRAST TECHNIQUE: Contiguous axial images were obtained from the base of the skull through the vertex without intravenous contrast. COMPARISON:  None. FINDINGS: Brain: No evidence of large-territorial acute infarction. No parenchymal hemorrhage. No mass lesion. No extra-axial collection. No mass effect or midline shift. No hydrocephalus. Basilar cisterns are patent. Vascular: No hyperdense vessel. Skull: No acute fracture or focal lesion. Sinuses/Orbits: Paranasal sinuses and mastoid air cells are clear. The orbits are unremarkable. Other: None. IMPRESSION: No acute intracranial abnormality. If high clinical suspicion for acute stroke, please consider MR brain noncontrast for further/mores sensitive evaluation. Electronically Signed   By: Tish Frederickson M.D.   On: 05/25/2020 19:08   MR Brain Wo Contrast (neuro protocol)  Result Date: 05/25/2020 CLINICAL DATA:  Initial evaluation for acute slurred speech, confusion. EXAM: MRI HEAD WITHOUT CONTRAST TECHNIQUE: Multiplanar, multiecho pulse sequences of the brain and surrounding structures were obtained without intravenous contrast. COMPARISON:  Prior CT from earlier same day. FINDINGS: Brain: Cerebral volume within normal limits for patient age. Single subcentimeter focus of FLAIR hyperintensity noted within the white matter of the right frontal corona radiata, nonspecific, but of doubtful significance in isolation. No other focal parenchymal signal abnormality. No abnormal foci of restricted diffusion to  suggest acute or subacute ischemia. Gray-white matter differentiation well maintained. No encephalomalacia to suggest chronic infarction. No foci of susceptibility artifact to suggest acute or chronic intracranial hemorrhage. No mass lesion, midline shift or mass effect. No hydrocephalus.  No extra-axial fluid collection. Major dural sinuses are grossly patent. Pituitary gland and suprasellar region are normal. Midline structures intact and normal. Vascular: Major intracranial vascular flow voids well maintained and normal in appearance. Skull and upper cervical spine: Craniocervical junction normal. Visualized upper cervical spine within normal limits. Bone marrow signal intensity normal. No scalp soft tissue abnormality. Sinuses/Orbits: Globes and orbital soft tissues within normal limits. Minimal mucosal thickening noted within the ethmoidal air cells. Paranasal sinuses are otherwise clear. No mastoid effusion. Inner ear structures grossly normal. Other: None. IMPRESSION: Normal brain MRI. No acute intracranial abnormality identified. Electronically Signed   By: Rise Mu M.D.   On: 05/25/2020 22:56       Subjective: Patient seen and examined at bedside.  Denies any overnight fever, nausea, vomiting or seizure activities.  Feels okay to go home today.  Discharge Exam: Vitals:   05/27/20 1000 05/27/20 1100  BP:    Pulse:    Resp: 16 16  Temp:    SpO2:      General: Pt is alert, awake, not in acute distress Cardiovascular: rate controlled, S1/S2 + Respiratory: bilateral decreased breath sounds at bases Abdominal: Soft, NT, ND, bowel sounds + Extremities: no edema, no cyanosis    The results of significant diagnostics from this hospitalization (including imaging, microbiology, ancillary and laboratory) are listed below for reference.     Microbiology: Recent Results (from the past 240 hour(s))  Respiratory Panel by RT PCR (Flu A&B, Covid) - Nasopharyngeal Swab     Status: None   Collection Time: 05/25/20  6:25 PM   Specimen: Nasopharyngeal Swab  Result Value Ref Range Status   SARS Coronavirus 2 by RT PCR NEGATIVE NEGATIVE Final    Comment: (NOTE) SARS-CoV-2 target nucleic acids are NOT DETECTED.  The SARS-CoV-2 RNA is generally detectable in upper  respiratoy specimens during the acute phase of infection. The lowest concentration of SARS-CoV-2 viral copies this assay can detect is 131 copies/mL. A negative result does not preclude SARS-Cov-2 infection and should not be used as the sole basis for treatment or other patient management decisions. A negative result may occur with  improper specimen collection/handling, submission of specimen other than nasopharyngeal swab, presence of viral mutation(s) within the areas targeted by this assay, and inadequate number of viral copies (<131 copies/mL). A negative result must be combined with clinical observations, patient history, and epidemiological information. The expected result is Negative.  Fact Sheet for Patients:  https://www.moore.com/  Fact Sheet for Healthcare Providers:  https://www.young.biz/  This test is no t yet approved or cleared by the [place] FDA and  has been authorized for detection and/or diagnosis of SARS-CoV-2 by FDA under an Emergency Use Authorization (EUA). This EUA will remain  in effect (meaning this test can be used) for the duration of the COVID-19 declaration under Section 564(b)(1) of the Act, 21 U.S.C. section 360bbb-3(b)(1), unless the authorization is terminated or revoked sooner.     Influenza A by PCR NEGATIVE NEGATIVE Final   Influenza B by PCR NEGATIVE NEGATIVE Final    Comment: (NOTE) The Xpert Xpress SARS-CoV-2/FLU/RSV assay is intended as an aid in  the diagnosis of influenza from Nasopharyngeal swab specimens and  should not be used as a sole basis for treatment. Nasal  washings and  aspirates are unacceptable for Xpert Xpress SARS-CoV-2/FLU/RSV  testing.  Fact Sheet for Patients: https://www.moore.com/  Fact Sheet for Healthcare Providers: https://www.young.biz/  This test is not yet approved or cleared by the [place] FDA and  has been  authorized for detection and/or diagnosis of SARS-CoV-2 by  FDA under an Emergency Use Authorization (EUA). This EUA will remain  in effect (meaning this test can be used) for the duration of the  Covid-19 declaration under Section 564(b)(1) of the Act, 21  U.S.C. section 360bbb-3(b)(1), unless the authorization is  terminated or revoked. Performed at [facility], [address], [place], [place] [address]   Culture, Urine     Status: Abnormal   Collection Time: 05/25/20 10:00 PM   Specimen: Urine, Random  Result Value Ref Range Status   Specimen Description   Final    URINE, RANDOM Performed at [facility], [address], [place], [place] [address]    Special Requests   Final    NONE Performed at [facility], [address], [place], [place] [address]    Culture >=100,000 COLONIES/mL ESCHERICHIA COLI (A)  Final   Report Status 05/27/2020 FINAL  Final   Organism ID, Bacteria ESCHERICHIA COLI (A)  Final      Susceptibility   Escherichia coli - MIC*    AMPICILLIN 8 SENSITIVE Sensitive     CEFAZOLIN <=4 SENSITIVE Sensitive     CEFEPIME <=0.12 SENSITIVE Sensitive     CEFTRIAXONE <=0.25 SENSITIVE Sensitive     CIPROFLOXACIN <=0.25 SENSITIVE Sensitive     GENTAMICIN <=1 SENSITIVE Sensitive     IMIPENEM <=0.25 SENSITIVE Sensitive     NITROFURANTOIN <=16 SENSITIVE Sensitive     TRIMETH/SULFA <=20 SENSITIVE Sensitive     AMPICILLIN/SULBACTAM 4 SENSITIVE Sensitive     PIP/TAZO <=4 SENSITIVE Sensitive     * >=100,000 COLONIES/mL ESCHERICHIA COLI     Labs: BNP (last 3 results) No results for input(s): BNP in the last 8760 hours. Basic Metabolic Panel: Recent Labs  Lab 05/25/20 1800 05/26/20 0520 05/27/20 0514  NA 134* 136 137  K 3.6 3.3* 3.6  CL 98 105 108  CO2 22 21* 21*  GLUCOSE 100* 96 89  BUN 10 9 12   CREATININE 0.81 0.74 0.63  CALCIUM 9.3 8.3* 8.3*  MG  --   --  2.2   Liver Function Tests: Recent Labs   Lab 05/25/20 1800 05/26/20 0520 05/27/20 0514  AST 112* 124* 98*  ALT 32 33 32  ALKPHOS 84 61 56  BILITOT 0.6 0.8 0.6  PROT 8.9* 6.7 6.5  ALBUMIN 4.5 3.4* 3.3*   No results for input(s): LIPASE, AMYLASE in the last 168 hours. Recent Labs  Lab 05/25/20 1923 05/27/20 0514  AMMONIA 21 21   CBC: Recent Labs  Lab 05/25/20 1800 05/26/20 0520 05/27/20 0514  WBC 7.3 4.7 3.2*  NEUTROABS 5.6  --  1.5*  HGB 14.3 11.7* 11.4*  HCT 43.9 37.8 35.7*  MCV 94.4 98.2 94.9  PLT 439* 329 299   Cardiac Enzymes: No results for input(s): CKTOTAL, CKMB, CKMBINDEX, TROPONINI in the last 168 hours. BNP: Invalid input(s): POCBNP CBG: Recent Labs  Lab 05/26/20 0108  GLUCAP 83   D-Dimer No results for input(s): DDIMER in the last 72 hours. Hgb A1c No results for input(s): HGBA1C in the last 72 hours. Lipid Profile No results for input(s): CHOL, HDL, LDLCALC, TRIG, CHOLHDL, LDLDIRECT in the last 72 hours. Thyroid function  studies Recent Labs    05/26/20 0521  TSH 9.362*   Anemia work up Recent Labs    05/26/20 0521 05/27/20 0514  VITAMINB12 236  --   FOLATE  --  8.2   Urinalysis    Component Value Date/Time   COLORURINE YELLOW 05/25/2020 2200   APPEARANCEUR CLOUDY (A) 05/25/2020 2200   LABSPEC 1.009 05/25/2020 2200   PHURINE 6.0 05/25/2020 2200   GLUCOSEU NEGATIVE 05/25/2020 2200   HGBUR MODERATE (A) 05/25/2020 2200   BILIRUBINUR NEGATIVE 05/25/2020 2200   KETONESUR NEGATIVE 05/25/2020 2200   PROTEINUR NEGATIVE 05/25/2020 2200   NITRITE POSITIVE (A) 05/25/2020 2200   LEUKOCYTESUR LARGE (A) 05/25/2020 2200   Sepsis Labs Invalid input(s): PROCALCITONIN,  WBC,  LACTICIDVEN Microbiology Recent Results (from the past 240 hour(s))  Respiratory Panel by RT PCR (Flu A&B, Covid) - Nasopharyngeal Swab     Status: None   Collection Time: 05/25/20  6:25 PM   Specimen: Nasopharyngeal Swab  Result Value Ref Range Status   SARS Coronavirus 2 by RT PCR NEGATIVE NEGATIVE Final     Comment: (NOTE) SARS-CoV-2 target nucleic acids are NOT DETECTED.  The SARS-CoV-2 RNA is generally detectable in upper respiratoy specimens during the acute phase of infection. The lowest concentration of SARS-CoV-2 viral copies this assay can detect is 131 copies/mL. A negative result does not preclude SARS-Cov-2 infection and should not be used as the sole basis for treatment or other patient management decisions. A negative result may occur with  improper specimen collection/handling, submission of specimen other than nasopharyngeal swab, presence of viral mutation(s) within the areas targeted by this assay, and inadequate number of viral copies (<131 copies/mL). A negative result must be combined with clinical observations, patient history, and epidemiological information. The expected result is Negative.  Fact Sheet for Patients:  https://www.moore.com/  Fact Sheet for Healthcare Providers:  https://www.young.biz/  This test is no t yet approved or cleared by the [place] FDA and  has been authorized for detection and/or diagnosis of SARS-CoV-2 by FDA under an Emergency Use Authorization (EUA). This EUA will remain  in effect (meaning this test can be used) for the duration of the COVID-19 declaration under Section 564(b)(1) of the Act, 21 U.S.C. section 360bbb-3(b)(1), unless the authorization is terminated or revoked sooner.     Influenza A by PCR NEGATIVE NEGATIVE Final   Influenza B by PCR NEGATIVE NEGATIVE Final    Comment: (NOTE) The Xpert Xpress SARS-CoV-2/FLU/RSV assay is intended as an aid in  the diagnosis of influenza from Nasopharyngeal swab specimens and  should not be used as a sole basis for treatment. Nasal washings and  aspirates are unacceptable for Xpert Xpress SARS-CoV-2/FLU/RSV  testing.  Fact Sheet for Patients: https://www.moore.com/  Fact Sheet for Healthcare  Providers: https://www.young.biz/  This test is not yet approved or cleared by the [place] FDA and  has been authorized for detection and/or diagnosis of SARS-CoV-2 by  FDA under an Emergency Use Authorization (EUA). This EUA will remain  in effect (meaning this test can be used) for the duration of the  Covid-19 declaration under Section 564(b)(1) of the Act, 21  U.S.C. section 360bbb-3(b)(1), unless the authorization is  terminated or revoked. Performed at [facility], [address], [place], [place] [address]   Culture, Urine     Status: Abnormal   Collection Time: 05/25/20 10:00 PM   Specimen: Urine, Random  Result Value Ref Range Status   Specimen Description   Final  URINE, RANDOM Performed at [facility], [address] [place], [place] [address]    Special Requests   Final    NONE Performed at [facility], [address] [place], [place] [address]    Culture >=100,000 COLONIES/mL ESCHERICHIA COLI (A)  Final   Report Status 05/27/2020 FINAL  Final   Organism ID, Bacteria ESCHERICHIA COLI (A)  Final      Susceptibility   Escherichia coli - MIC*    AMPICILLIN 8 SENSITIVE Sensitive     CEFAZOLIN <=4 SENSITIVE Sensitive     CEFEPIME <=0.12 SENSITIVE Sensitive     CEFTRIAXONE <=0.25 SENSITIVE Sensitive     CIPROFLOXACIN <=0.25 SENSITIVE Sensitive     GENTAMICIN <=1 SENSITIVE Sensitive     IMIPENEM <=0.25 SENSITIVE Sensitive     NITROFURANTOIN <=16 SENSITIVE Sensitive     TRIMETH/SULFA <=20 SENSITIVE Sensitive     AMPICILLIN/SULBACTAM 4 SENSITIVE Sensitive     PIP/TAZO <=4 SENSITIVE Sensitive     * >=100,000 COLONIES/mL ESCHERICHIA COLI     Time coordinating discharge: 35 minutes  SIGNED:   Glade LloydKshitiz Kang Ishida, MD  [facility] 05/27/2020, 11:41 AM

## 2020-05-28 ENCOUNTER — Other Ambulatory Visit: Payer: Self-pay | Admitting: Neurology

## 2020-05-28 DIAGNOSIS — E063 Autoimmune thyroiditis: Secondary | ICD-10-CM

## 2020-05-28 DIAGNOSIS — G9349 Other encephalopathy: Secondary | ICD-10-CM

## 2020-05-28 LAB — ANTI-DNA ANTIBODY, DOUBLE-STRANDED: ds DNA Ab: <1 IU/mL (ref 0–9)

## 2020-05-28 LAB — THYROID STIMULATING IMMUNOGLOBULIN: Thyroid Stimulating Immunoglob: <0.1 IU/L (ref 0.00–0.55)

## 2020-05-28 LAB — CERULOPLASMIN: Ceruloplasmin: 31.6 mg/dL (ref 19.0–39.0)

## 2020-05-28 LAB — THYROID PEROXIDASE ANTIBODY: Thyroperoxidase Ab SerPl-aCnc: >600 IU/mL — ABNORMAL HIGH (ref 0–34)

## 2020-05-28 LAB — ANA W/REFLEX IF POSITIVE: Anti Nuclear Antibody (ANA): NEGATIVE

## 2020-05-28 NOTE — Progress Notes (Signed)
Given very high titer positive TPO antibody, referrals placed to neurology and endocrinology

## 2020-05-29 LAB — THYROGLOBULIN ANTIBODY: Thyroglobulin Antibody: >2250 IU/mL — AB (ref 0.0–0.9)

## 2020-05-29 LAB — METHYLMALONIC ACID, SERUM: Methylmalonic Acid, Quantitative: 100 nmol/L (ref 0–378)

## 2020-05-30 ENCOUNTER — Telehealth: Payer: Self-pay | Admitting: Neurology

## 2020-05-30 NOTE — Telephone Encounter (Signed)
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 Numbers for [facility] and [facility] endocrinology as well as for Karn Pickler (my Environmental health practitioner) were provided.   Brooke Dare MD-PhD [facility] [phone]

## 2020-05-31 LAB — COPPER, SERUM: Copper: 135 ug/dL (ref 80–158)

## 2020-06-02 ENCOUNTER — Ambulatory Visit: Payer: Self-pay | Admitting: Neurology

## 2020-06-02 ENCOUNTER — Encounter: Payer: Self-pay | Admitting: Neurology

## 2020-06-02 ENCOUNTER — Other Ambulatory Visit: Payer: Self-pay

## 2020-06-02 DIAGNOSIS — E063 Autoimmune thyroiditis: Secondary | ICD-10-CM

## 2020-06-02 DIAGNOSIS — F5104 Psychophysiologic insomnia: Secondary | ICD-10-CM

## 2020-06-02 DIAGNOSIS — G9349 Other encephalopathy: Secondary | ICD-10-CM

## 2020-06-02 HISTORY — DX: Autoimmune thyroiditis: E06.3

## 2020-06-02 HISTORY — DX: Psychophysiologic insomnia: F51.04

## 2020-06-02 LAB — VITAMIN B1: Vitamin B1 (Thiamine): 201.4 nmol/L — ABNORMAL HIGH (ref 66.5–200.0)

## 2020-06-02 MED ORDER — PREDNISONE 10 MG PO TABS: ORAL_TABLET | ORAL | 0 refills | Status: DC

## 2020-06-02 MED ORDER — TRAZODONE HCL 50 MG PO TABS: 100.0000 mg | ORAL_TABLET | Freq: Every day | ORAL | 1 refills | Status: DC

## 2020-06-02 NOTE — Progress Notes (Signed)
Reason for visit: Altered mental status  Referring physician: [facility]  Glenda Smith is a 49 y.o. female  History of present illness:  Glenda Smith is a 49 year old right-handed white female with a history of bipolar disorder diagnosed about 2 years ago.  The patient has been followed by Dr. Evelene Croon from psychiatry, she was being treated with alprazolam and Adderall.  The patient was given low-dose doxepin 10 mg for sleep at night, the patient chronically has had difficulty with insomnia throughout her life.  The patient may go several days sleeping well and then have 2-3 nights where she does not sleep.  The patient has had some increased problems with focusing over the last 2 years.  She has had some intermittent hallucinations over the last 1 year, she may have visual or auditory components with the hallucination.  The patient of the last 3 months has had occasional episodes of twitching or jerking.  The patient was admitted to the hospital on 25 May 2020 with a more sudden change in mental status.  Her husband noted that the patient would have episodes of confusion, stuttering speech, this would seem to clear and then come back on her.  In the hospital, she was noted to have writhing movements and jerking.  The patient underwent MRI of the brain that was unremarkable, urine drug screen was positive for benzodiazepines and for amphetamine.  The patient claims that she did not take her Adderall the day of the hospital admission.  The patient underwent further blood work evaluation and was noted to have elevated thyroid peroxidase antibodies and thyroglobulin antibodies.  The possibility of a Hashimoto encephalopathy was entertained.  The TSH was somewhat elevated in the 9 range.  The patient has been set up to see endocrinology.  The patient has been doing fairly well out of the hospital, no further episodes of confusion have been noted.  The patient does report difficulty with focusing, some  memory issues have been noted.  The patient does report some slight weakness in the right hand, she will drop things off and on.  She has not had any other focal numbness or weakness.  She does have a history of headaches that have been occurring 2-3 times a week, the headaches may be associated with photophobia and phonophobia, and are generally around the right frontotemporal region and periorbital region.  She may have some blurring of vision in the right eye with a headache.  The patient reports some occasional neck pain, no problems with back pain, she has had some problems with slight gait instability for the last 3 years or so.  She does have a history of frequent mood swings.  She is sent to this office for further evaluation.  Past Medical History:  Diagnosis Date  . Diabetes mellitus without complication (HCC)   . Hypertension   . Migraine     No past surgical history on file.  No family history on file.  Social history:  reports that she has quit smoking. She has never used smokeless tobacco. She reports current alcohol use. She reports previous drug use.  Medications:  Prior to Admission medications   Medication Sig Start Date End Date Taking? Authorizing Provider  cholecalciferol (VITAMIN D3) 25 MCG (1000 UNIT) tablet Take 1,000 Units by mouth daily.   Yes [provider]  vitamin B-12 1000 MCG tablet Take 1 tablet (1,000 mcg total) by mouth daily. 06/02/20  Yes Glade Lloyd, MD  ALPRAZolam Prudy Feeler) 0.5  MG tablet Take 0.5 mg by mouth 4 (four) times daily as needed for anxiety or sleep.  Patient not taking: Reported on 06/02/2020 04/21/20   [provider]  ibuprofen (ADVIL) 200 MG tablet Take 400 mg by mouth every 6 (six) hours as needed for moderate pain. Patient not taking: Reported on 06/02/2020    [provider]     No Known Allergies  ROS:  Out of a complete 14 system review of symptoms, the patient complains only of the following symptoms,  and all other reviewed systems are negative.  Mood swings Insomnia Headache  Blood pressure 117/78, pulse 93, height 5\' 3"  (1.6 m), weight 143 lb (64.9 kg).  Physical Exam  General: The patient is alert and cooperative at the time of the examination.  Eyes: Pupils are equal, round, and reactive to light. Discs are flat bilaterally.  Neck: The neck is supple, no carotid bruits are noted.  Respiratory: The respiratory examination is clear.  Cardiovascular: The cardiovascular examination reveals a regular rate and rhythm, no obvious murmurs or rubs are noted.  Skin: Extremities are without significant edema.  Neurologic Exam  Mental status: The patient is alert and oriented x 3 at the time of the examination. The patient has apparent normal recent and remote memory, with an apparently normal attention span and concentration ability.  Mini-Mental status examination done today shows a total score 29/30.  Cranial nerves: Facial symmetry is present. There is good sensation of the face to pinprick and soft touch bilaterally. The strength of the facial muscles and the muscles to head turning and shoulder shrug are normal bilaterally. Speech is well enunciated, no aphasia or dysarthria is noted. Extraocular movements are full. Visual fields are full. The tongue is midline, and the patient has symmetric elevation of the soft palate. No obvious hearing deficits are noted.  Motor: The motor testing reveals 5 over 5 strength of all 4 extremities, with exception some slight decrease in grip in the right hand. Good symmetric motor tone is noted throughout.  Sensory: Sensory testing is intact to pinprick, soft touch, vibration sensation, and position sense on all 4 extremities, with exception of some decreased pinprick sensation on the right hand. No evidence of extinction is noted.  Coordination: Cerebellar testing reveals good finger-nose-finger and heel-to-shin bilaterally.  Gait and station:  Gait is normal. Tandem gait is normal. Romberg is negative. No drift is seen.  Reflexes: Deep tendon reflexes are symmetric and normal bilaterally. Toes are downgoing bilaterally.   MRI brain 05/25/20:  IMPRESSION: Normal brain MRI. No acute intracranial abnormality identified.  * MRI scan images were reviewed online. I agree with the written report.   EEG 05/26/20:  IMPRESSION: This study is within normal limits. No seizures or epileptiform discharges were seen throughout the recording.   Assessment/Plan:  1.  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  Chronic insomnia  The patient has had various features that could be consistent with Hashimoto's encephalopathy with psychiatric symptoms, and recent problems with jerking and choreoathetosis, altered mental status.  The patient will be placed on a prednisone taper and then return here over the next several months every 4 weeks for IV Solu-Medrol injections.  The patient will be set up for a formal neuropsychological testing procedure.  The patient will be given trazodone for sleep regulation.  She will go on 5 mg of melatonin at night.  She will stay off of the Adderall.  She will follow up here in 3  months.  Marlan Palau MD 06/02/2020 7:28 AM  [facility] [address] [place], [place] [address]  Phone [phone] Fax [phone]

## 2020-06-10 LAB — ANTIPHOSPHOLIPID SYNDROME EVAL, BLD
Anticardiolipin IgA: <9 APL U/mL (ref 0–11)
Anticardiolipin IgG: <9 GPL U/mL (ref 0–14)
Anticardiolipin IgM: 9 MPL U/mL (ref 0–12)
DRVVT: 43.1 s (ref 0.0–47.0)
PTT Lupus Anticoagulant: 47.3 s (ref 0.0–51.9)
Phosphatydalserine, IgG: <10 Units (ref 0–30)
Phosphatydalserine, IgM: <22 MPS IgM (ref <22)

## 2020-06-15 LAB — MISC LABCORP TEST (SEND OUT): Labcorp test code: 117906

## 2020-06-30 ENCOUNTER — Telehealth: Payer: Self-pay | Admitting: Neurology

## 2020-06-30 NOTE — Telephone Encounter (Signed)
Call patient to set up Solu-Medrol injections.  She will receive 500 mg IV Solu-Medrol each month for 4 months.  This is for Hashimoto's encephalopathy.

## 2020-07-31 ENCOUNTER — Other Ambulatory Visit: Payer: Self-pay | Admitting: Neurology

## 2020-08-03 ENCOUNTER — Ambulatory Visit (INDEPENDENT_AMBULATORY_CARE_PROVIDER_SITE_OTHER): Payer: Self-pay | Admitting: Internal Medicine

## 2020-08-03 ENCOUNTER — Other Ambulatory Visit: Payer: Self-pay

## 2020-08-03 ENCOUNTER — Encounter: Payer: Self-pay | Admitting: Internal Medicine

## 2020-08-03 VITALS — BP 144/82 | HR 108 | Ht 63.0 in | Wt 145.0 lb

## 2020-08-03 DIAGNOSIS — E063 Autoimmune thyroiditis: Secondary | ICD-10-CM

## 2020-08-03 DIAGNOSIS — E010 Iodine-deficiency related diffuse (endemic) goiter: Secondary | ICD-10-CM | POA: Insufficient documentation

## 2020-08-03 LAB — TSH: TSH: 9.62 u[IU]/mL — ABNORMAL HIGH (ref 0.35–4.50)

## 2020-08-03 LAB — T4, FREE: Free T4: 0.63 ng/dL (ref 0.60–1.60)

## 2020-08-03 NOTE — Progress Notes (Signed)
Name: Glenda Smith  MRN/ DOB: [id number], [birth year]    Age/ Sex: 50 y.o., female    PCP: Patient, No Pcp Per   Reason for Endocrinology Evaluation: Hashimoto's Thyroiditis      Date of Initial Endocrinology Evaluation: 08/03/2020     HPI: [name] is a 50 y.o. female with a past medical history of Bipolar disorder . The patient presented for initial endocrinology clinic visit on 08/03/2020 for consultative assistance with her Hashimoto's Thyroiditis .   Pt has been diagnosed with hashimoto's thyroid disease in 05/2020 with an elevated Anti-TPO Ab levels at > 600 IU/mL and an elevated TSH at 9.362 uIU/mL .   Of note the pt has been evaluated by neurology for intermittent episode of confusion, slurred speech  And eyes rolling to the back of the head.    She was put on prednisone for a short period of time.   The pt follows with Dr. Evelene Croon from psychiatry. She is on Trazodone for insomnia. She uses Adderall during the week during work hours. She is also on Xanax for anxiety. She was treated for bipolar disorder at some point.    Weight has been stable  She has intermittent insomnia alternating with hypersomnia.  Has occasional constipation  Has depression and anxiety    IN the 1996  when she was pregnant she was diagnosed with hypothyroidism, she was started on Levothyroxine but was taken off after her labs were normal.     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   Social History:  reports that she has quit smoking. She has never used smokeless tobacco. She reports current alcohol use. She reports previous drug use.  Family History: family history includes  Cancer in her father; Depression in her mother and sister.   HOME MEDICATIONS: Allergies as of 08/03/2020   No Known Allergies     Medication List       Accurate as of August 03, 2020 10:52 AM. If you have any questions, ask your nurse or doctor.        STOP taking these medications   predniSONE 10 MG tablet Commonly known as: DELTASONE Stopped by: Scarlette Shorts, MD     TAKE these medications   cholecalciferol 25 MCG (1000 UNIT) tablet Commonly known as: VITAMIN D3 Take 1,000 Units by mouth daily.   cyanocobalamin 1000 MCG tablet Take 1 tablet (1,000 mcg total) by mouth daily.   traZODone 50 MG tablet Commonly known as: DESYREL Take 2 tablets (100 mg total) by mouth at bedtime.         REVIEW OF SYSTEMS: A comprehensive ROS was conducted with the patient and is negative except as per HPI     OBJECTIVE:  VS: Ht 5\' 3"  (1.6 m)    Wt 145 lb (65.8 kg)    BMI 25.69 kg/m    Wt Readings from Last 3 Encounters:  08/03/20 145 lb (65.8 kg)  06/02/20 143 lb (64.9 kg)  05/26/20 146 lb 9.7 oz (66.5 kg)     EXAM: General: Pt appears well and is in NAD  Hydration: Well-hydrated with moist mucous membranes and good  skin turgor  Eyes: External eye exam normal without stare, lid lag or exophthalmos.  EOM intact.  PERRL.  Neck: General: Supple without adenopathy. Thyroid: Thyroid asymmetry noted L>R   Lungs: Clear with good BS bilat with no rales, rhonchi, or wheezes  Heart: Auscultation: RRR.  Abdomen: Normoactive bowel sounds, soft, nontender, without masses or organomegaly palpable  Extremities:  BL LE: No pretibial edema normal ROM and strength.  Skin: Hair: Texture and amount normal with gender appropriate distribution Skin Inspection: No rashes, acanthosis nigricans/skin tags. No lipohypertrophy Skin Palpation: Skin temperature, texture, and thickness normal to palpation  Neuro: Cranial nerves: II - XII grossly intact  Motor: Normal strength  throughout DTRs: 2+ and symmetric in UE without delay in relaxation phase  Mental Status: Judgment, insight: Intact Orientation: Oriented to time, place, and person Mood and affect: No depression, anxiety, or agitation     DATA REVIEWED:  Results for Glenda, Smith (MRN [id number]) as of 08/04/2020 08:12  Ref. Range 08/03/2020 11:25  TSH Latest Ref Range: 0.35 - 4.50 uIU/mL 9.62 (H)  T4,Free(Direct) Latest Ref Range: 0.60 - 1.60 ng/dL 5.68      Results for Glenda, DOMBEK (MRN [id number]) as of 08/03/2020 07:09  Ref. Range 05/27/2020 08:06  Thyroperoxidase Ab SerPl-aCnc Latest Ref Range: 0 - 34 IU/mL >600 (H)  Thyroglobulin Antibody Latest Ref Range: 0.0 - 0.9 IU/mL >2,250.0 (A)  THYROID STIMULATING IMMUNOGLOBULIN Unknown Rpt   Results for Glenda, Smith (MRN [id number]) as of 08/03/2020 07:09  Ref. Range 05/26/2020 05:21  TSH Latest Ref Range: 0.350 - 4.500 uIU/mL 9.362 (H)   ASSESSMENT/PLAN/RECOMMENDATIONS:   1. Hashimoto's Thyroiditis :  - Pt with non-specific symptoms that could be attributed to her thyroid - No local neck symptoms  - TSH remains elevated, will start LT-4 replacement  - Pt educated extensively on the correct way to take levothyroxine (first thing in the morning with water, 30 minutes before eating or taking other medications). - Pt encouraged to double dose the following day if she were to miss a dose given long half-life of levothyroxine.   Medications : Start Levothyroxine 50 mcg daily   2. Thyromegaly :    - Noted to have an asymmetric thyroid exam, L> R , will proceed with thyroid ultrasound   F/U in 4 months    Signed electronically by: Lyndle Herrlich, MD  [facility] End[facility]inology  [facility] [address] [place] [place], [place] [address] Phone: [phone] FAX: [phone]   CC: Patient, No Pcp Per No address on file Phone: None Fax: None   Return to Endocrinology clinic as below: Future Appointments   Date Time Provider Department Center  09/08/2020  8:30 AM York Spaniel, MD [facility] None

## 2020-08-04 ENCOUNTER — Telehealth: Payer: Self-pay | Admitting: Internal Medicine

## 2020-08-04 MED ORDER — LEVOTHYROXINE SODIUM 50 MCG PO TABS: 50.0000 ug | ORAL_TABLET | Freq: Every day | ORAL | 3 refills | Status: DC

## 2020-08-04 NOTE — Telephone Encounter (Signed)
Per DPR, left detail message for patient of Dr Shamleffer's comments 

## 2020-08-04 NOTE — Telephone Encounter (Signed)
Please let the pt know that her thyroid is still off and she will need to be started on thyroid hormone therapy.    Levothyroxine 50 mcg daily    To be taken in the morning on empty stomach, half an hour before breakfast     Thanks   Abby Raelyn Mora, MD  [facility] Endocrinology  [facility] [address] [place], [place] [address] Phone: [phone] FAX: [phone]

## 2020-08-18 ENCOUNTER — Ambulatory Visit
Admission: RE | Admit: 2020-08-18 | Discharge: 2020-08-18 | Disposition: A | Discharge status: Home / Self Care | Payer: Self-pay | Source: Ambulatory Visit | Attending: Internal Medicine | Admitting: Internal Medicine

## 2020-08-18 DIAGNOSIS — E010 Iodine-deficiency related diffuse (endemic) goiter: Secondary | ICD-10-CM

## 2020-08-24 ENCOUNTER — Encounter: Payer: Self-pay | Admitting: Internal Medicine

## 2020-08-31 ENCOUNTER — Other Ambulatory Visit: Payer: Self-pay | Admitting: Neurology

## 2020-09-08 ENCOUNTER — Encounter: Payer: Self-pay | Admitting: Neurology

## 2020-09-08 ENCOUNTER — Ambulatory Visit: Payer: Self-pay | Admitting: Neurology

## 2020-09-08 VITALS — BP 142/94 | HR 73 | Ht 63.0 in | Wt 148.4 lb

## 2020-09-08 DIAGNOSIS — G9349 Other encephalopathy: Secondary | ICD-10-CM

## 2020-09-08 DIAGNOSIS — E063 Autoimmune thyroiditis: Secondary | ICD-10-CM

## 2020-09-08 MED ORDER — TRAZODONE HCL 50 MG PO TABS
150.0000 mg | ORAL_TABLET | Freq: Every day | ORAL | 2 refills | Status: DC
Start: 2020-09-08 — End: 2020-12-20

## 2020-09-08 NOTE — Progress Notes (Signed)
Reason for visit: Hashimoto's encephalopathy  Glenda Smith is an 50 y.o. 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  The patient was set up for a series of Solu-Medrol infusions, 500 mg each month for 4 months.  She apparently has missed her appointments and she has not gotten the infusions.  She continues to have some problems with nervousness and occasional myoclonic jerks and some cognitive issues.  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  She has had chronic insomnia since she was 50 years old.  She is on trazodone 100 mg at night but this is not sufficient to help her sleep.  She is taking Adderall off and on if needed.  She does not take this regularly.  She returns the office today for further evaluation.  She indicates that overall she is really no better than what she was when I saw her previously.  She is now followed through endocrinology for her thyroid issue.  Past Medical History:  Diagnosis Date  . Bipolar 1 disorder (HCC)   . Chronic insomnia 06/02/2020  . 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 Hypertension   . Migraine     No past surgical history on file.  Family History  Problem Relation Age of Onset  . Depression Mother   . Cancer Father        throat  . Depression Sister     Social history:  reports that she has quit smoking. She has never used smokeless tobacco. She reports current alcohol use. She reports previous drug use.   No Known Allergies  Medications:  Prior to Admission medications   Medication Sig Start Date End Date Taking? Authorizing Provider  cholecalciferol (VITAMIN D3) 25 MCG (1000 UNIT) tablet Take 1,000 Units by mouth daily.   Yes [provider]  levothyroxine (SYNTHROID) 50 MCG tablet Take 1 tablet (50 mcg total) by mouth daily. 08/04/20  Yes Shamleffer, Konrad Dolores, MD   traZODone (DESYREL) 50 MG tablet TAKE 2 TABLETS BY MOUTH AT BEDTIME 08/31/20  Yes York Spaniel, MD  vitamin B-12 1000 MCG tablet Take 1 tablet (1,000 mcg total) by mouth daily. 06/02/20  Yes Glade Lloyd, MD    ROS:  Out of a complete 14 system review of symptoms, the patient complains only of the following symptoms, and all other reviewed systems are negative.  Insomnia Anxiety Memory problems  Blood pressure (!) 142/94, pulse 73, height 5\' 3"  (1.6 m), weight 148 lb 6.4 oz (67.3 kg).  Physical Exam  General: The patient is alert and cooperative at the time of the examination.  Skin: No significant peripheral edema is noted.   Neurologic Exam  Mental status: The patient is alert and oriented x 3 at the time of the examination. The patient has apparent normal recent and remote memory, with an apparently normal attention span and concentration ability.   Cranial nerves: Facial symmetry is present. Speech is normal, no aphasia or dysarthria is noted. Extraocular movements are full. Visual fields are full.  Motor: The patient has good strength in all 4 extremities.  Sensory examination: Soft touch sensation is symmetric on the face, arms, and legs.  Coordination: The patient has good finger-nose-finger and heel-to-shin bilaterally.  Gait and station: The patient has a normal gait. Tandem gait is normal. Romberg is negative. No drift is seen.  Reflexes: Deep tendon reflexes  are symmetric.   Assessment/Plan:  1.  Hashimoto encephalopathy  2.  Chronic insomnia  3.  Bipolar disorder  The patient will increase the trazodone to 150 mg at night, she will call if this is not helpful.  The patient will be set up again for neuropsychological testing which was never done.  The patient again will be set up for IV Solu-Medrol infusions, 500 mg each month for 4 months, she will follow up here in 4 months.  Marlan Palau MD 09/08/2020 8:11 AM  [facility] [address] [place], [place] [address]  Phone [phone] Fax [phone]

## 2020-09-12 ENCOUNTER — Encounter: Payer: Self-pay | Admitting: Counselor

## 2020-10-06 ENCOUNTER — Telehealth: Payer: Self-pay | Admitting: Neurology

## 2020-10-06 NOTE — Telephone Encounter (Signed)
This patient did not show for her scheduled Solu-Medrol infusion on 06 October 2018.

## 2020-10-20 ENCOUNTER — Ambulatory Visit (INDEPENDENT_AMBULATORY_CARE_PROVIDER_SITE_OTHER): Payer: Self-pay | Admitting: Counselor

## 2020-10-20 ENCOUNTER — Other Ambulatory Visit: Payer: Self-pay

## 2020-10-20 ENCOUNTER — Encounter: Payer: Self-pay | Admitting: Counselor

## 2020-10-20 ENCOUNTER — Ambulatory Visit: Payer: Self-pay

## 2020-10-20 DIAGNOSIS — E063 Autoimmune thyroiditis: Secondary | ICD-10-CM

## 2020-10-20 DIAGNOSIS — F09 Unspecified mental disorder due to known physiological condition: Secondary | ICD-10-CM

## 2020-10-20 DIAGNOSIS — G9349 Other encephalopathy: Secondary | ICD-10-CM

## 2020-10-20 DIAGNOSIS — G3184 Mild cognitive impairment, so stated: Secondary | ICD-10-CM

## 2020-10-20 DIAGNOSIS — F3181 Bipolar II disorder: Secondary | ICD-10-CM
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
## 2020-10-20 NOTE — Progress Notes (Signed)
   Psychometrist Note   Cognitive testing was administered to Glenda Smith by Lamar Benes, B.S. (Technician) under the supervision of Alphonzo Severance, Psy.D., ABN. [name] was able to tolerate all test procedures. Dr. Nicole Kindred met with the patient as needed to manage any emotional reactions to the testing procedures. Rest breaks were offered.    The battery of tests administered was selected by Dr. Nicole Kindred with consideration to the patient's current level of functioning, the nature of her symptoms, emotional and behavioral responses during the interview, level of literacy, observed level of motivation/effort, and the nature of the referral question. This battery was communicated to the psychometrist. Communication between Dr. Nicole Kindred and the psychometrist was ongoing throughout the evaluation and Dr. Nicole Kindred was immediately accessible at all times. Dr. Nicole Kindred provided supervision to the technician on the date of this service, to the extent necessary to assure the quality of all services provided.    Ms. Heidemann will return in approximately one week for an interactive feedback session with Dr. Nicole Kindred, at which time test performance, clinical impressions, and treatment recommendations will be reviewed in detail. The patient understands she can contact our office should she require our assistance before this time.   A total of 140 minutes of billable time were spent with Glenda Smith by the technician, including test administration and scoring time. Billing for these services is reflected in Dr. Les Pou note.   This note reflects time spent with the psychometrician and does not include test scores, clinical history, or any interpretations made by Dr. Nicole Kindred. The full report will follow in a separate note.

## 2020-10-20 NOTE — Progress Notes (Signed)
NEUROPSYCHOLOGICAL TEST SCORES [facility]  Patient Name: Glenda Smith MRN: [id number] Date of Birth: June 17, 1971 Age: 50 y.o. Education: 14 years  Measurement properties of test scores: IQ, Index, and Standard Scores (SS): Mean = 100; Standard Deviation = 15 Scaled Scores (Ss): Mean = 10; Standard Deviation = 3 Z scores (Z): Mean = 0; Standard Deviation = 1 T scores (T); Mean = 50; Standard Deviation = 10  TEST SCORES:    Note: This summary of test scores accompanies the interpretive report and should not be interpreted by unqualified individuals or in isolation without reference to the report. Test scores are relative to age, gender, and educational history as available and appropriate.   Performance Validity        ACS: Raw  Descriptor      Word Choice 43 Within Expectation       Raw  Descriptor  The Dot Counting Test: 11 Within Expectation      Embedded Measures: Raw  Descriptor      NAB Effort Index 0 Within Expectation      Mental Status Screening     Total Score Descriptor  MoCA 25 Normal      Expected Functioning        Wide Range Achievement Test: Standard/Scaled Score Percentile      Word Reading 110 75      Reynolds Intellectual Screening Test Standard/T-score Percentile      Guess What 59 82      Odd Item Out 44 27  RIST Index 103 58      Attention/Processing Speed        Neuropsychological Assessment Battery (Attention Module, Form 1): Scaled/T-score Percentile  Attention Index (ATT): 84 14      Dots 43 25      Digits Forward 42 21      Digits Backwards 48 42      Numbers & Letters A Efficiency 48 42      Numbers & Letters B Efficiency 36 8      Numbers & Letters C Efficiency 38 12      Numbers & Letters D Efficiency 52 58      Driving Scenes 41 18      Language        Neuropsychological Assessment Battery (Language Module, Form 1): T-score Percentile      Naming   (31) 55 69      Verbal Fluency:  T Score Percentile      Controlled  Oral Word Association (F-A-S) 57 75      Semantic Fluency (Animals) 51 54      Memory:        Neuropsychological Assessment Battery (Memory Module, Form 1): T-score/Standard Score Percentile  Memory Index (MEM): 101 53      List Learning           List A Immediate Recall   (5 , 9 , 9) 46 34         List B Immediate Recall   (4) 45 31         List A Short Delayed Recall   (7) 45 31         List A Long Delayed Recall   (7) 46 34         List A Percent Retention   (100 %) --- 54         List A Long Delayed Yes/No Recognition Hits   (11) --- 58  List A Long Delayed Yes/No Recognition False Alarms   (0) --- 79         List A Recognition Discriminability Index --- 79      Shape Learning           Immediate Recognition   (5 , 5 , 7) 51 54         Delayed Recognition   (5) 43 25         Percent Retention   (71 %) --- 16         Delayed Forced-Choice Recognition Hits   (9) --- 86         Delayed Forced-Choice Recognition False Alarms   (0) --- 73         Delayed Forced-Choice Recognition Discriminability --- 88      Story Learning           Immediate Recall   (35, 37) 59 82         Delayed Recall   (37) 57 75         Percent Retention   (100 %) --- 69      Daily Living Memory            Immediate Recall   (25, 20) 52 58          Delayed Recall   (9, 7) 59 82          Percent Retention (94 %) --- 58          Recognition Hits   (9) --- 50      Visuospatial/Constructional Functioning        Neuropsychological Assessment Battery (Visuospatial Module) T-score Percentile      Visual Discrimination 41 18      Design Construction 36 8      Executive Functioning        Modified [place] Card Sorting Test (MWCST): Standard/T-Score Percentile      Number of Categories Correct 55 69      Number of Perseverative Errors 45 31      Number of Total Errors 45 31      Percent Perseverative Errors 46 34  Executive Function Composite 100 50          Trail Making Test: T-Score Percentile       Part A 55 69      Part B 51 54      Clock Drawing Raw Score Descriptor      Command 9 WNL      Rating Scales         Raw Score Descriptor  Patient Health Questionnaire - 9 16 Moderately-Severe  GAD-7 18 Severe   Buckley Bradly V. Roseanne Reno PsyD, ABN Clinical Neuropsychologist

## 2020-10-25 NOTE — Progress Notes (Signed)
NEUROPSYCHOLOGICAL EVALUATION [facility]  Patient Name: Glenda Smith MRN: [id number] Date of Birth: [birth year] Age: 50 y.o. Education: 14 years  Clinical Impressions  Glenda Smith is a 50 y.o., right-hand dominant, married woman with a history of Bipolar II disorder, chronic insomnia, and more recently cognitive and neuropsychiatric changes that began around 2 years ago. She started having irritability, forgetfulness, and some minor confusion while driving. This culminated in a hospitalization related to AMS in November, 2021 during which myoclonic type movements and choreoathetoid movements were noticed. She was discharged after only two days with improved mental status. 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 Anne Hahn who referred her for evaluation. In addition to her psychiatric concerns and day-to-day forgetfulness, she has been having formed visual hallucinations of her grandfather and is acting out her dreams. She feels clumsy and has some falls but has had no Parkinsonism on exam with Dr. Anne Hahn. She is following with Dr. Milagros Evener for psychiatric medication management but has not tolerated side effects of mood stabilizing agents or antidepressants and instead is taking Adderall and Benzodiazepines.   Neuropsychological testing showed subtle deficits in several areas although overall, the extent of her difficulties appears quite mild.  Less than expected performance was demonstrated on measures of attention and processing speed at an index level, although the number of low subtest scores is not unusual. She also had less than expected performance on measures of visuospatial and constructional abilities. Adequate performance was observed in other areas. She reports moderately severe levels of depressive symptoms and severe levels  of anxiety symptomatology on self-rating scales, including passive non-suicidal morbid ideation.   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 The best diagnosis given her relatively well-preserved real world functioning is a mild neurocognitive disorder. I concur with Dr. Anne Hahn that she has many features of Hasimoto's and that this is the most likely cause of her difficulties. Cognitive profiles are not well characterized due to the rarity of the condition, but her attention and processing speed difficulties certainly fit well. There is a differential, however, and I would also at least consider the possibility of an early synucleinopathy given dream enactment behavior, formed visual hallucinations, and the presence of additional visuospatial impairment. I do not think her presentation well described by a psychiatric condition alone, although her psychiatric/neuropsychiatric illness is certainly contributing and should be a focus of treatment. Insufficient sleep and poor sleep pattern may also be a aggravating factors and of course, she may have some combination of the above noted factors. Would recommend consideration of sleep study for further investigation of possible RBD. I will also reinforce with the patient the importance of adhering to the treatment that has been prescribed, because she has not yet obtained her solu-medrol infusions.   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 I would like to underscore that these changes are mild and there are no areas of very severe or significant impairment,  which is positive, and suggests you are holding up well. You had less than expected scores on measures of attention and  processing efficiency and also on measures of visuospatial abilities (this involves perceiving and analyzing visual information). The best diagnosis in the context of your clinical history is thus mild neurocognitive disorder.   Mild neurocognitive disorder is a term for memory and thinking problems that do not appreciably impact your day-to-day function. While individuals with mild neurocognitive disorder may be less efficient or effective than in the past, by definition they are still able to do all the things to which they are accustomed and do not experience material impairment in any major functional area. Prognostically mild neurocognitive disorder can either get better, get worse, or stay the same depending on the underlying cause.  In your case, I am not entirely sure what is causing your mild neurocognitive disorder, but I am inclined to agree with Dr. Anne HahnWillis that the most likely cause is Hashimoto's encephalopathy. This is a relative rare condition that involves memory and thinking changes thought to be caused by immune-system modulated processes that affect the brain. This typically presents in the 4th or 5th decade of life, is more common in woman than men, and can involve many of the other symptoms you have in addition to cognitive problems (e.g., hallucinations, irritability and "neuropsychiatric" changes, movement changes). If this is the cause of your cognitive problems then it may be reversible, and the treatment of choice is steroid infusion.   I suggest that you adhere diligently to the treatment Dr. Anne HahnWillis has prescribed for you (I.e., solu-medrol infusion). This is an important first step in addressing your issues and could be curative if they are in fact due to Hashimoto's encephalopathy.   I also think that you do have an active psychiatric disorder. Bipolar II is a  reasonable working diagnosis and you are already working with Dr. Evelene CroonKaur on treatment. You may also have a neuropsychiatric disorder. Neuropsychiatric disorder is the term for when brain changes result in emotional/psychological changes that resemble those we expect to see in mental health diagnoses like depression. This means that your mental health issues and mood problems may improve with improvement of whatever condition is causing your memory and thinking problems.   While I am not licensed to prescribe medications as a psychologist, I would like you to know that medications such as Adderall can make insomnia worse and contribute to further agitation and restlessness, which you seem to have. Medications such as Alprazolam can make memory and thinking problems worse. It may thus be beneficial to consider titration as previously recommended by Psychiatry but you will need to discuss that with Dr. Evelene CroonKaur and your other prescribing providers.   You may wish to consider psychotherapy in addition to medication treatment in the future, although at present, I think achieving stability from a medical and psychiatric medication standpoint should be prioritized. This is something to consider down the road.   For your information, I think there is likely a neuropsychiatric component to your emotional issues. This means that your medical problems and whatever changes are occurring in the brain are manifesting with problems like what we expect to see in psychiatric disorders. This may be helpful to know, because you may have some improvement when your underlying conditions are addressed. It can also be helpful for family members to know that this is not just you being difficult and is in fact part of the illness.   While my expectation would be that you will improve with resolution of your suspected Hashimoto's, it is also possible that this is due to something  else and will worsen over time (although I think that is  much, much less likely). Therefore, I suggest that you return for reevaluation in 1 to 2 years or sooner as clinically indicated to follow your progress over time. If you wish to gauge your response to treatment and return for that reason after you get better, that would also be reasonable.   Test Findings  Test scores are summarized in additional documentation associated with this encounter. Test scores are relative to age, gender, and educational history as available and appropriate. There were no concerns about performance validity as all findings fell within normal expectations.   General Intellectual Functioning/Achievement:  Performance on single word reading was high average whereas her overall performance on the RIST index was average. High average performance was noted on the verbally mediated subtest and performance at the lower extreme of the average range was noted on the more visually oriented subtest of the RIST. This may suggest premorbid weakness in visuospatial abilities but could also relate to acquired decline.   Attention and Processing Efficiency: Performance on indicators of attention and processing efficiency was below expectations for an individual of Ms. Zuidema measured overall ability as per the RIST index. The extent of the discrepancy is very unusual and would be expected in less than 10% of normal individuals. Nevertheless, the number of low subtest scores is not unusual. This may reflect some overall diminution of capacities in the absence of significant impairment at an individual ability level. Digit repetition forward was low average whereas digit repetition backward was average. Performance on visually mediated indicators involving identifying changes to a series of printed stimulus materials was low average.   On timed indicators of processing efficiency, letter cancellation was average and alternating attention between letter cancellation and mental addition was  average. By contrast, letter counting and mental math under time pressure clustered around the margin of the unusually low/low average range.   Language: Language findings showed normal errorless visual object confrontation naming. Generation of words in response to the letter prompts "F-A-S" was high average whereas generation of words in a given category was low average.   Visuospatial Function: Performance on measures of visuospatial and constructional abilities was below expectations with the number of low scores expected in < 10% of normal individuals. Visual discrimination and attention to detail when matching a target design to a series of choices was low average. Construction two-dimensional target designs from a series of small plastic tile pieces was unusually low.   Learning and Memory: Performance on measures of learning and memory was within normal expectations for an individual of Ms. Lipps overall ability level as per the RIST index. She demonstrated good ability to acquire and retain information over time.   In the verbal realm, she learned 5, 9, and 9 words of a 12-item word list across three learning trials. Following short and long delays, she recalled 7 words, which is average. Recognition discriminability for the words versus false choices was also good and fell at a high average level. Memory for a short story was good with high average immediate and delayed recall. Memory for brief daily-living type information was average on immediate recall and high average on delayed recall. Delayed recognition was average.   In the visual realm, learning a series of designs that are difficult to verbally encode was average followed by low average delayed recall. Recognition of the information contained amongst distractor alternate items was high average.   Executive Functions: Performance was reasonable  on executive measures with average alternating sequencing of numbers and letters and  EF Composite score on the Modified [employer]. Generation of words in response to given letters was high average. Clock drawing was within normal limits, with good reproduction other than no size difference between hands.   Rating Scale(s): Ms. Hargrove reported moderately severe levels of depressive symptoms and severe levels of anxiety symptoms on self-rating scales of those constructions. She did respond that she has been bothered by thoughts she would be better off dead or of harming herself several days over the past two weeks. I queried that with her, and she clarified that she has no active suicidal intent or plan, but she does think "I wish this would stop" or other similar self-statements representing nonsuicidal morbid ideation.   Bettye Boeck Roseanne Reno, PsyD, ABN Clinical Neuropsychologist  Coding and Compliance  Billing below reflects technician time, my direct face-to-face time with the patient, time spent in test administration, and time spent in professional activities including but not limited to: neuropsychological test interpretation, integration of neuropsychological test data with clinical history, report preparation, treatment planning, care coordination, and review of diagnostically pertinent medical history or studies.   Services associated with this encounter: Clinical Interview [phone]) plus 215 minutes (96132/96133; Neuropsychological Evaluation by Professional)  23 minutes (96136/96137; Test Administration by Professional) 140 minutes (96138/96139; Neuropsychological Testing by Technician)

## 2020-10-27 ENCOUNTER — Encounter: Payer: Self-pay | Admitting: Counselor

## 2020-10-27 ENCOUNTER — Other Ambulatory Visit: Payer: Self-pay

## 2020-10-27 ENCOUNTER — Ambulatory Visit (INDEPENDENT_AMBULATORY_CARE_PROVIDER_SITE_OTHER): Payer: Self-pay | Admitting: Counselor

## 2020-10-27 DIAGNOSIS — E063 Autoimmune thyroiditis: Secondary | ICD-10-CM

## 2020-10-27 DIAGNOSIS — F3181 Bipolar II disorder: Secondary | ICD-10-CM

## 2020-10-27 DIAGNOSIS — G3184 Mild cognitive impairment, so stated: Secondary | ICD-10-CM

## 2020-10-27 DIAGNOSIS — G9349 Other encephalopathy: Secondary | ICD-10-CM

## 2020-10-27 NOTE — Progress Notes (Signed)
NEUROPSYCHOLOGY FEEDBACK NOTE [facility]  Feedback Note: I met with Glenda Smith to review the findings resulting from her neuropsychological evaluation. Since the last appointment, she has been about the same. She came with her husband Glenda Smith, who generally corroborated the history that she provided and also shared his experience of what this time has been like. It sounds like her actions have taken a toll on their relationship and she also worries about her children. Time was spent reviewing the impressions and recommendations that are detailed in the evaluation report. We discussed impression of likely Hashimoto's encephalopathy. The patient stated that she did not intentionally no-show her infusions, and simply didn't know they were scheduled. She stated she has undergone one infusion although I do not see that in the chart. I reinforced with her the importance of the treatment Dr. Jannifer Franklin has prescribed and she is amenable to going forward with it. She will also continue to work with Dr. Toy Care on medications, because I think Bipolar II is a big part of her difficulties. Other topics of conversation as reflected in the patient instructions. I took time to explain the findings and answer all the patient's questions. I encouraged Glenda Smith to contact me should she have any further questions or if further follow up is desired.   Current Medications and Medical History   Current Outpatient Medications  Medication Sig Dispense Refill  . cholecalciferol (VITAMIN D3) 25 MCG (1000 UNIT) tablet Take 1,000 Units by mouth daily.    Marland Kitchen levothyroxine (SYNTHROID) 50 MCG tablet Take 1 tablet (50 mcg total) by mouth daily. 90 tablet 3  . traZODone (DESYREL) 50 MG tablet Take 3 tablets (150 mg total) by mouth at bedtime. 90 tablet 2  . vitamin B-12 1000 MCG tablet Take 1 tablet (1,000 mcg total) by mouth daily. 30 tablet 0   No current facility-administered medications for this visit.    Patient  Active Problem List   Diagnosis Date Noted  . Thyromegaly 08/03/2020  . Hashimoto's thyroiditis 08/03/2020  . 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 Chronic insomnia 06/02/2020  . Altered mental status, unspecified 05/26/2020  . AMS (altered mental status) 05/25/2020    Mental Status and Behavioral Observations  Glenda Smith presented on time to the present encounter and was alert and generally oriented. Speech was normal in rate, rhythm, volume, and prosody. Self-reported mood was "allright" and affect was neutral. Thought process was logical and goal oriented and thought content was appropriate. There were no safety concerns identified at today's encounter, such as thoughts of harming self or others.   Plan  Feedback provided regarding the patient's neuropsychological evaluation. She knows she needs to adhere diligently to steroid treatment and expressed a willingness to do so. We also discussed treatments for Bipolar II, which I think will be most effective in her case if she can gain some control via medications first. I explained that tolerating a reasonable level of side effects may be required and that often times, patients do not "like" medications that control their Bipolar II symptoms, which is why she got off the lamotrigine. Down the road, therapy may also be helpful. Explained the importance of routine to the patient and her husband and also importance of health behaviors such as getting sufficient sleep and the like. Glenda Smith was encouraged to contact me if any questions arise or if further follow up is desired.   Viviano Simas Nicole Kindred, PsyD, ABN Clinical Neuropsychologist  Service(s) Provided at This Encounter: 20  minutes (51025; Conjoint therapy with patient present)

## 2020-10-27 NOTE — Patient Instructions (Signed)
Your performance and presentation on assessment are consistent with a couple areas of less than expected performance and the findings generally do substantiate the changes that you have noticed in your functioning. I would like to underscore that these changes are mild and there are no areas of very severe or significant impairment, which is positive, and suggests you are holding up well. You had less than expected scores on measures of attention and processing efficiency and also on measures of visuospatial abilities (this involves perceiving and analyzing visual information). The best diagnosis in the context of your clinical history is thus mild neurocognitive disorder.   Mild neurocognitive disorder is a term for memory and thinking problems that do not appreciably impact your day-to-day function. While individuals with mild neurocognitive disorder may be less efficient or effective than in the past, by definition they are still able to do all the things to which they are accustomed and do not experience material impairment in any major functional area. Prognostically mild neurocognitive disorder can either get better, get worse, or stay the same depending on the underlying cause.  In your case, I am not entirely sure what is causing your mild neurocognitive disorder, but I am inclined to agree with Dr. 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 This is a relative rare condition that involves memory and thinking changes thought to be caused by immune-system modulated processes that affect the brain. This typically presents in the 4th or 5th decade of life, is more common in woman than men, and can involve many of the other symptoms you have in addition to cognitive problems (e.g., hallucinations, irritability and "neuropsychiatric" changes, movement changes). If this is the cause of your cognitive problems then it may be reversible, and the treatment of choice is steroid infusion.    I suggest that you adhere diligently to the treatment Dr. Anne Hahn has prescribed for you (I.e., solu-medrol infusion). This is an important first step in addressing your issues and could be curative if they are in fact due to Hashimoto's encephalopathy.   I also think that you do have an active psychiatric disorder. Bipolar II is a reasonable working diagnosis and you are already working with Dr. Evelene Croon on treatment. You may also have a neuropsychiatric disorder. Neuropsychiatric disorder is the term for when brain changes result in emotional/psychological changes that resemble those we expect to see in mental health diagnoses like depression. This means that your mental health issues and mood problems may improve with improvement of whatever condition is causing your memory and thinking problems.   While I am not licensed to prescribe medications as a psychologist, I would like you to know that medications such as Adderall can make insomnia worse and contribute to further agitation and restlessness, which you seem to have. Medications such as Alprazolam can make memory and thinking problems worse. It may thus be beneficial to consider titration as previously recommended by Psychiatry but you will need to discuss that with Dr. Evelene Croon and your other prescribing providers.   You may wish to consider psychotherapy in addition to medication treatment in the future, although at present, I think achieving stability from a medical and psychiatric medication standpoint should be prioritized. This is something to consider down the road.   For your information, I think there is likely a neuropsychiatric component to your emotional issues. This means that your medical problems and whatever changes are occurring in the brain are manifesting with problems like what we expect to see  in psychiatric disorders. This may be helpful to know, because you may have some improvement when your underlying conditions are  addressed. It can also be helpful for family members to know that this is not just you being difficult and is in fact part of the illness.   While my expectation would be that you will improve with resolution of your suspected Hashimoto's, it is also possible that this is due to something else and will worsen over time (although I think that is much, much less likely). Therefore, I suggest that you return for reevaluation in 1 to 2 years or sooner as clinically indicated to follow your progress over time. If you wish to gauge your response to treatment and return for that reason after you get better, that would also be reasonable.

## 2020-12-01 ENCOUNTER — Ambulatory Visit: Payer: Self-pay | Admitting: Internal Medicine

## 2020-12-15 ENCOUNTER — Encounter: Payer: Self-pay | Admitting: Internal Medicine

## 2020-12-15 ENCOUNTER — Ambulatory Visit: Payer: Self-pay | Admitting: Internal Medicine

## 2020-12-15 ENCOUNTER — Other Ambulatory Visit: Payer: Self-pay

## 2020-12-15 VITALS — BP 120/82 | HR 71 | Ht 63.0 in | Wt 157.2 lb

## 2020-12-15 DIAGNOSIS — E063 Autoimmune thyroiditis: Secondary | ICD-10-CM

## 2020-12-15 LAB — TSH: TSH: 12.88 u[IU]/mL — ABNORMAL HIGH (ref 0.35–4.50)

## 2020-12-15 NOTE — Progress Notes (Signed)
Name: Glenda Smith  MRN/ DOB: [id number], [birth year]    Age/ Sex: 51 y.o., female     PCP: Patient, No Pcp Per (Inactive)   Reason for Endocrinology Evaluation: Hashimoto's Thyroiditis      Initial Endocrinology Clinic Visit: 08/03/2020    PATIENT IDENTIFIER: Glenda Smith is a 50 y.o., female with a past medical history of bipolar disroder and Hashimoto's Thyroiditis . She has followed with [facility] since 08/03/2020 for consultative assistance with management of her Hashimoto's Thyroiditis .   HISTORICAL SUMMARY:   Pt has been diagnosed with hashimoto's thyroid disease in 05/2020 with an elevated Anti-TPO Ab levels at > 600 IU/mL and an elevated TSH at 9.362 uIU/mL .   Of note the pt has been evaluated by neurology for intermittent episode of confusion, slurred speech  And eyes rolling to the back of the head.    She was put on prednisone for a short period of time.   The pt follows with Dr. Evelene Croon from psychiatry. She is on Trazodone for insomnia. She uses Adderall during the week during work hours. She is also on Xanax for anxiety. She was treated for bipolar disorder at some point.    IN the 1996  when she was diagnosed with hypothyroidism during pregnancy, she was started on Levothyroxine but was taken off after her labs were normal.   Thyroid ultrasound (07/2020) was unrevealing showing heterogenous gland without nodules    Paternal grandmother with Graves' disease  Has FH of diabetes  On her initial visit to our clinic she had a TSH of 9.362 uIU/mL and was restarted on LT-4 replacement.   SUBJECTIVE:     Today (12/15/2020):  Glenda Smith is here for Hashimoto's Thyroiditis.  She has been noted with weight gain  She has been missing a few doses  She continues with insomnia   Denies constipation     Levothyroxine 50 mcg daily   HISTORY:  Past Medical History:  Past Medical History:  Diagnosis Date  . Bipolar 1 disorder (HCC)    . Chronic insomnia 06/02/2020  . 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 Hypertension   . Migraine      Past Surgical History: No past surgical history on file.  Social History:  reports that she has quit smoking. She has never used smokeless tobacco. She reports current alcohol use. She reports previous drug use. Family History:  Family History  Problem Relation Age of Onset  . Depression Mother   . Cancer Father        throat  . Depression Sister       HOME MEDICATIONS: Allergies as of 12/15/2020   No Known Allergies     Medication List       Accurate as of Dec 15, 2020  7:26 AM. If you have any questions, ask your nurse or doctor.        cholecalciferol 25 MCG (1000 UNIT) tablet Commonly known as: VITAMIN D3 Take 1,000 Units by mouth daily.   cyanocobalamin 1000 MCG tablet Take 1 tablet (1,000 mcg total) by mouth daily.   levothyroxine 50 MCG tablet Commonly known as: SYNTHROID Take 1 tablet (50 mcg total) by mouth daily.   traZODone 50 MG tablet Commonly known as: DESYREL Take 3 tablets (150 mg total) by mouth at bedtime.         OBJECTIVE:   PHYSICAL EXAM: VS: There were no vitals taken for this visit.   EXAM: General: Pt appears well and  is in NAD  Neck: General: Supple without adenopathy. Thyroid: Thyroid size normal.  No goiter or nodules appreciated  Lungs: Clear with good BS bilat with no rales, rhonchi, or wheezes  Heart: Auscultation: RRR.  Abdomen: Normoactive bowel sounds, soft, nontender, without masses or organomegaly palpable  Extremities:  BL LE: No pretibial edema normal ROM and strength.  Mental Status: Judgment, insight: Intact Orientation: Oriented to time, place, and person Memory: Intact for recent and remote events Mood and affect: No depression, anxiety, or agitation     DATA REVIEWED: Results for Glenda Smith, Glenda Smith (MRN [id number]) as of 12/16/2020 10:26  Ref. Range 12/15/2020 09:16  TSH Latest Ref Range: 0.35 - 4.50  uIU/mL 12.88 (H)     Thyroid ULtrasound 08/18/2020  Diffusely heterogeneous thyroid without discrete nodules.  ASSESSMENT / PLAN / RECOMMENDATIONS:   1. Hashimoto's Thyroiditis :   - Pt continues with weight gain, insomnia, and fatue - She admits to imperfect adherence to LT-4 replacement  - - Pt educated extensively on the correct way to take levothyroxine (first thing in the morning with water, 30 minutes before eating or taking other medications). - Pt encouraged to double dose the following day if she were to miss a dose given long half-life of levothyroxine. - TSh worsening, will increase the dose and emphasize compliance   Medications  Stop Levothyroxine 50 mcg  Start Levothyroxine 75 mcg daily     F/U in 6 months    Addendum: Left a message on 12/16/2020 at 10;30  Signed electronically by: Lyndle Herrlich, MD  [facility]  [facility] [address] [place] [place], [place] [address] Phone: [phone] FAX: [phone]      CC: Patient, No Pcp Per (Inactive) No address on file Phone: None  Fax: None   Return to Endocrinology clinic as below: Future Appointments  Date Time Provider Department Center  12/15/2020  8:50 AM Westlynn Fifer, Konrad Dolores, MD [facility] None  12/27/2020  9:00 AM York Spaniel, MD [facility] None

## 2020-12-15 NOTE — Patient Instructions (Signed)

## 2020-12-16 MED ORDER — LEVOTHYROXINE SODIUM 75 MCG PO TABS: 75.0000 ug | ORAL_TABLET | Freq: Every day | ORAL | 3 refills | Status: DC

## 2020-12-20 ENCOUNTER — Other Ambulatory Visit: Payer: Self-pay | Admitting: Neurology

## 2020-12-27 ENCOUNTER — Ambulatory Visit: Payer: Self-pay | Admitting: Neurology

## 2020-12-27 ENCOUNTER — Telehealth: Payer: Self-pay | Admitting: Neurology

## 2020-12-27 ENCOUNTER — Encounter: Payer: Self-pay | Admitting: Neurology

## 2020-12-27 NOTE — Telephone Encounter (Signed)
This patient did not show for a revisit appointment today. 

## 2021-01-12 ENCOUNTER — Other Ambulatory Visit: Payer: Self-pay | Admitting: Neurology

## 2021-02-18 ENCOUNTER — Other Ambulatory Visit: Payer: Self-pay | Admitting: Neurology

## 2021-03-18 IMAGING — CT CT CHEST-ABD-PELV W/ CM
2 of 5 series · 13 of 36 positions shown, 15 images · IV contrast (OMNIPAQUE)
Comparison: Brain MRI 05/25/2020.

CLINICAL DATA: 49-year-old female with unexplained altered mental
status.

EXAM:
CT CHEST, ABDOMEN, AND PELVIS WITH CONTRAST
TECHNIQUE: Multidetector CT imaging of the chest, abdomen and pelvis was
performed following the standard protocol during bolus
administration of intravenous contrast.
CONTRAST:  100mL OMNIPAQUE IOHEXOL 300 MG/ML  SOLN

[Series 2: cap with · axial · 0.67mm/px · z∈[-576,-81]mm · 10 of 123 slices shown, 12 images]
[im 12/123  mediastinal]
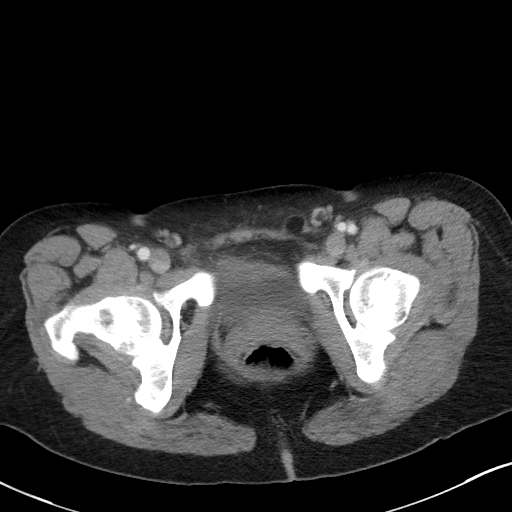
[im 12/123  bone]
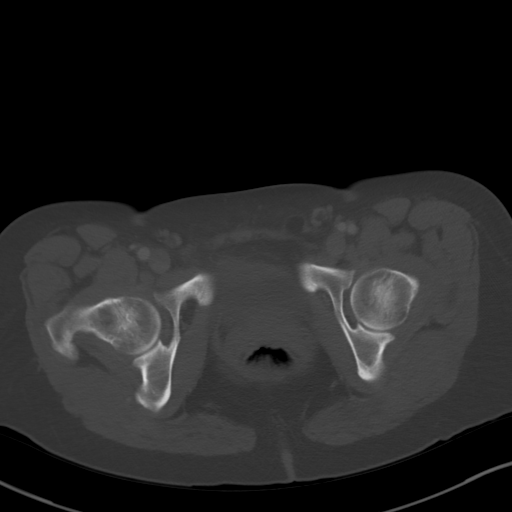
[im 23/123  mediastinal]
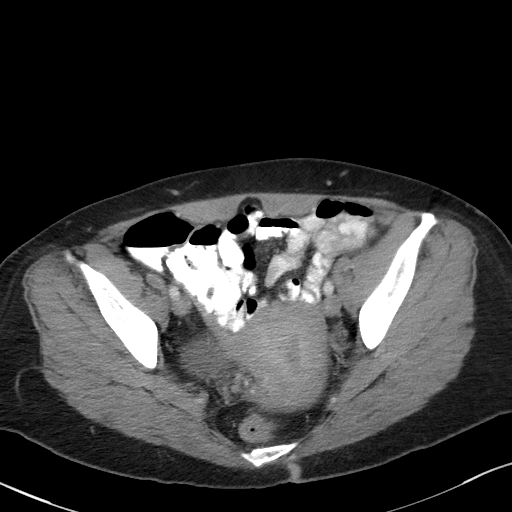
[im 34/123  mediastinal]
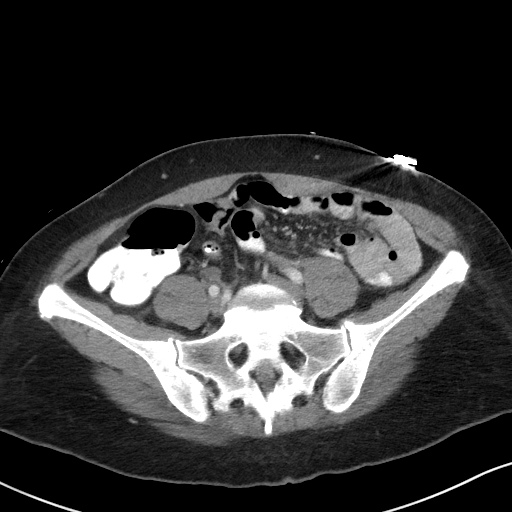
[im 45/123  mediastinal]
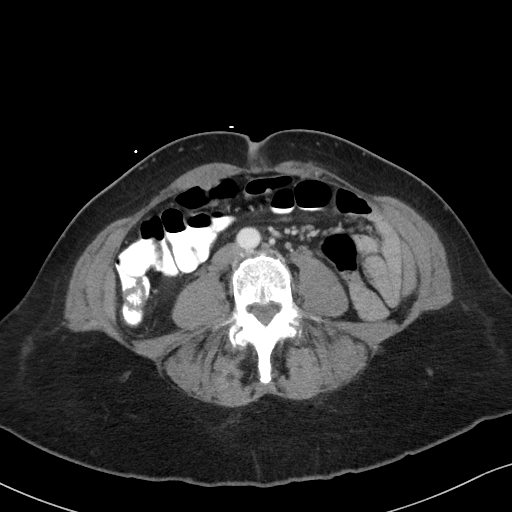
[im 56/123  mediastinal]
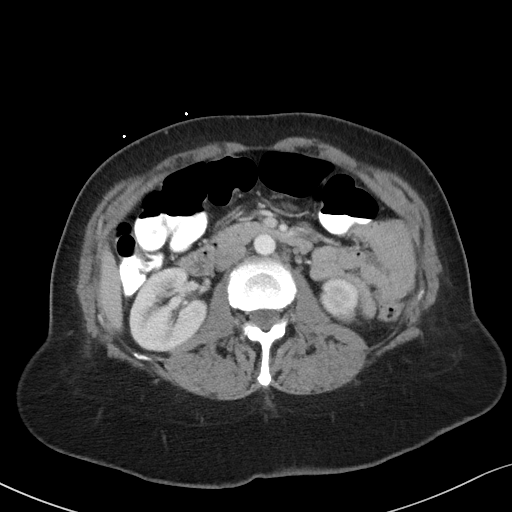
[im 67/123  mediastinal]
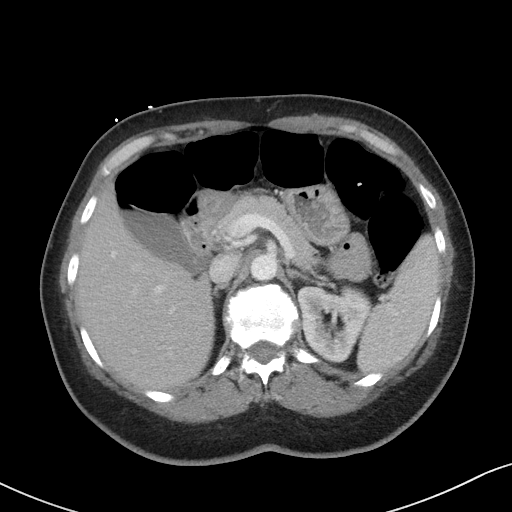
[im 78/123  mediastinal]
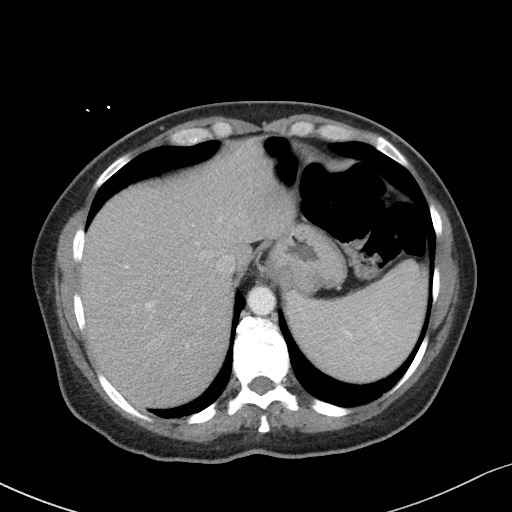
[im 89/123  mediastinal]
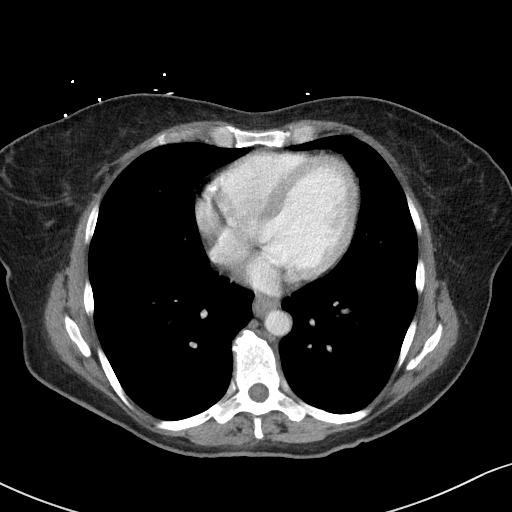
[im 100/123  mediastinal]
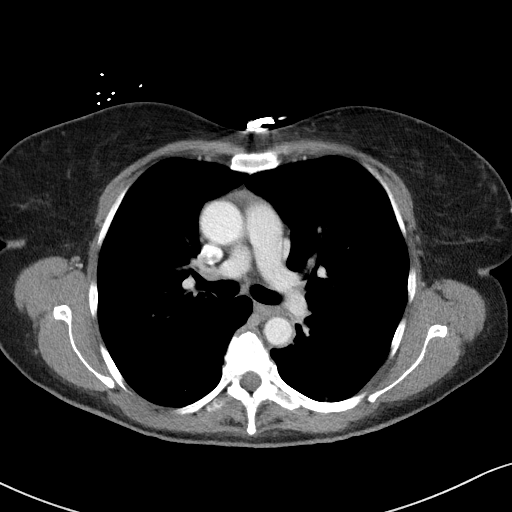
[im 100/123  bone]
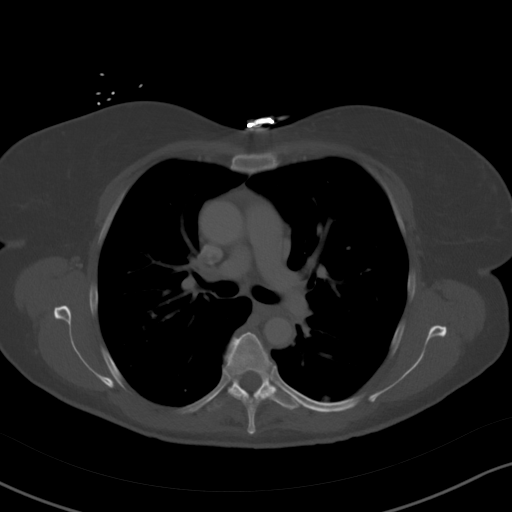
[im 111/123  mediastinal]
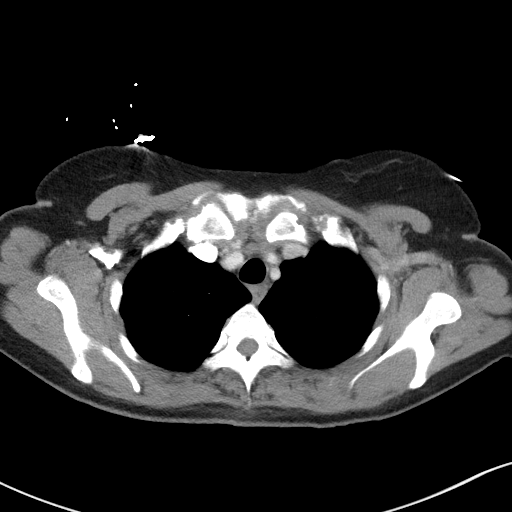

[Series 4: coronals · coronal · 0.90mm/px · 3 of 121 slices shown]
[im 25/121  mediastinal]
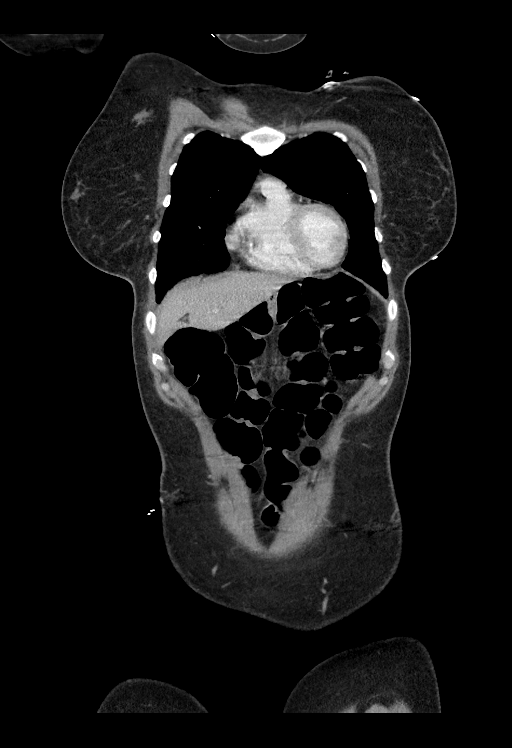
[im 49/121  mediastinal]
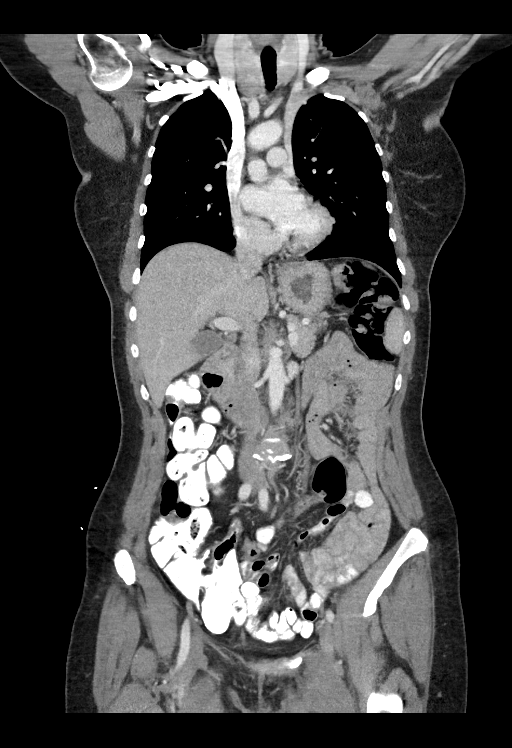
[im 73/121  mediastinal]
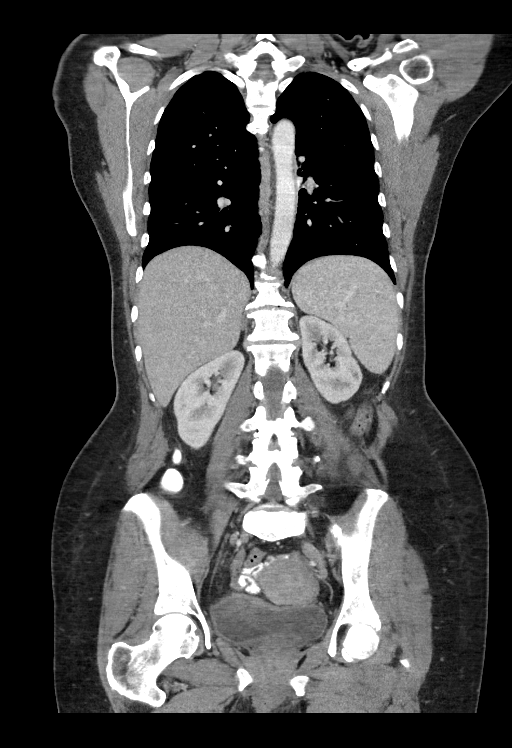

[13 of 36 positions shown; findings below may reference images not displayed]

FINDINGS: CT CHEST FINDINGS

Cardiovascular: Negative thoracic aorta. No cardiomegaly. No
pericardial effusion. No calcified coronary artery atherosclerosis
is evident.

Mediastinum/Nodes: Small hiatal hernia. Small volume residual thymus
in the anterior superior mediastinum (perhaps related to recent
vaccination in this age group). Otherwise negative mediastinum. No
lymphadenopathy.

Thyromegaly (series 2, image 7).  No discrete thyroid nodule by CT.

Lungs/Pleura: Multifocal nodular and indistinct peribronchial
opacities in the superior segment of the left lower lobe (series 6,
images 46 through 74. Major airways remain patent.

More subtle similar peribronchial opacity and nodularity in the
right upper lobe (series 6, images 34 through 64).

Subtle involvement of the superior segment right lower lobe on
series 6, image 63.

Other bilateral pulmonary lobes appear spared. No pleural effusion.
Minor dependent atelectasis.

Musculoskeletal: Degenerative changes in the spine. No acute osseous
abnormality identified.

CT ABDOMEN PELVIS FINDINGS

Hepatobiliary: Negative liver and gallbladder.

Pancreas: Negative.

Spleen: Negative.

Adrenals/Urinary Tract: Normal adrenal glands.

Kidneys are within normal limits. No nephrolithiasis. Symmetric
renal enhancement and contrast excretion. Proximal ureters appear
normal. Mild bladder wall thickening but relatively decompressed
urinary bladder. No perivesical stranding.

Stomach/Bowel: Oral contrast administered and has reached the distal
transverse colon. The large bowel is highly redundant, especially
the transverse and sigmoid segments. Decompressed distal colon. No
large bowel inflammation identified. Negative terminal ileum. Normal
appendix visible on coronal image 59.

No dilated small bowel. Small hiatal hernia but otherwise
unremarkable stomach and duodenum. No free air, free fluid,
mesenteric stranding.

Vascular/Lymphatic: Major arterial structures are patent. No
atherosclerosis identified. Portal venous system appears to be
patent. No lymphadenopathy.

Reproductive: Within normal limits.

Other: No pelvic free fluid.

Musculoskeletal: Mild for age degenerative changes in the lumbar
spine. No acute osseous abnormality identified.
IMPRESSION: 1. Left lower lobe superior segment bronchopneumonia with early
involvement of the right upper and lower lobes. Viral/atypical
infectious etiology is possible but the appearance is NOT typical of
[id number]. No pleural effusion.

2. No other acute or inflammatory process identified in the chest,
abdomen, or pelvis.

3. Thyromegaly.  Redundant large bowel.  Small hiatal hernia.

## 2021-03-20 ENCOUNTER — Other Ambulatory Visit: Payer: Self-pay | Admitting: Neurology

## 2021-04-19 ENCOUNTER — Other Ambulatory Visit: Payer: Self-pay | Admitting: Internal Medicine

## 2021-06-09 IMAGING — US US THYROID
1 series · 14 of 25 positions shown · non-contrast
Comparison: None.

CLINICAL DATA: Enlarged thyroid

Hashimoto's thyroiditis
EXAM:
THYROID ULTRASOUND
TECHNIQUE: Ultrasound examination of the thyroid gland and adjacent soft
tissues was performed.

[Series 1: us thyroid · 0.08mm/px · 45 acquisitions, 14 frames shown]
[im 1/45]
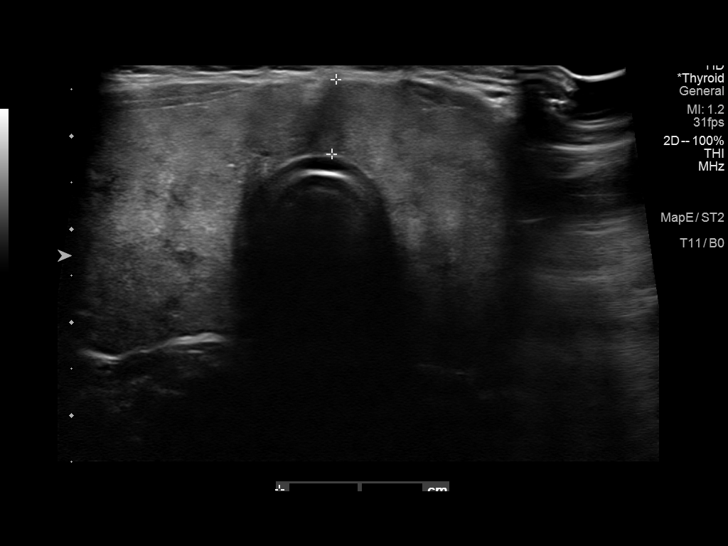
[im 4/45]
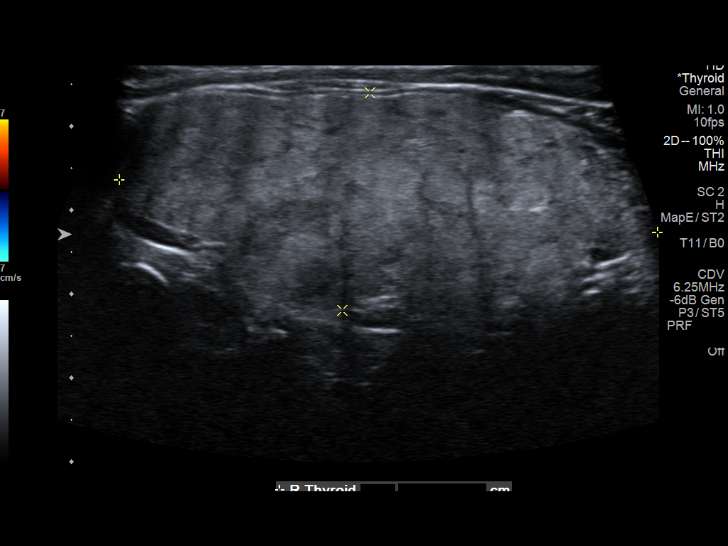
[im 8/45]
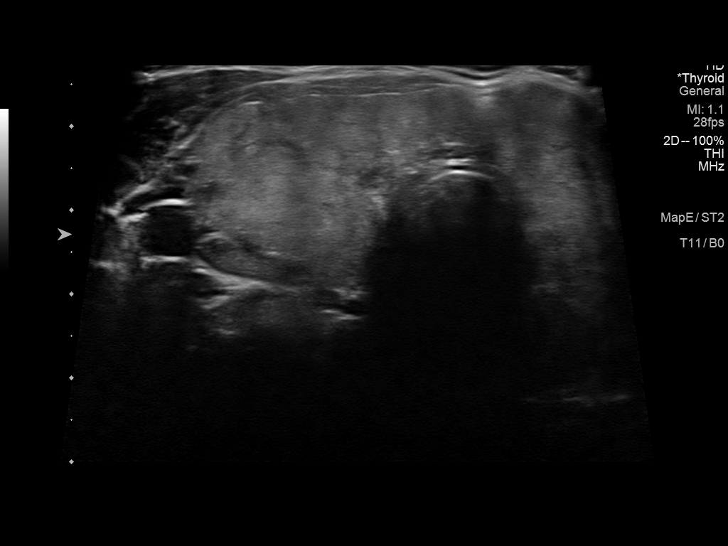
[im 12/45]
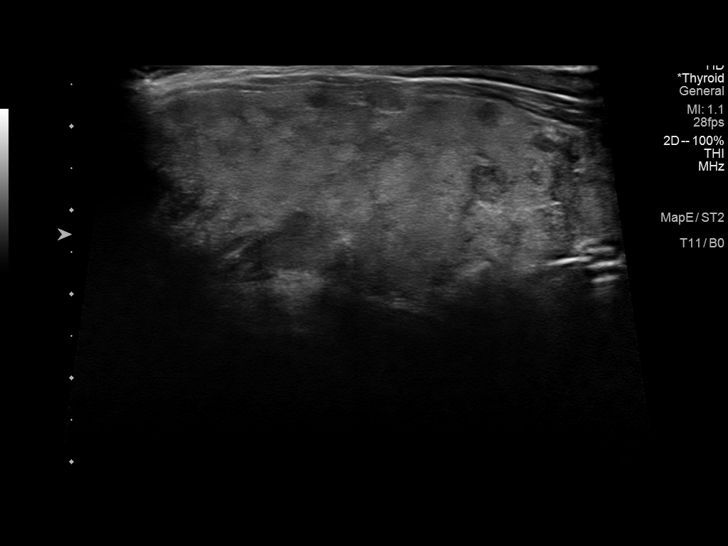
[im 15/45]
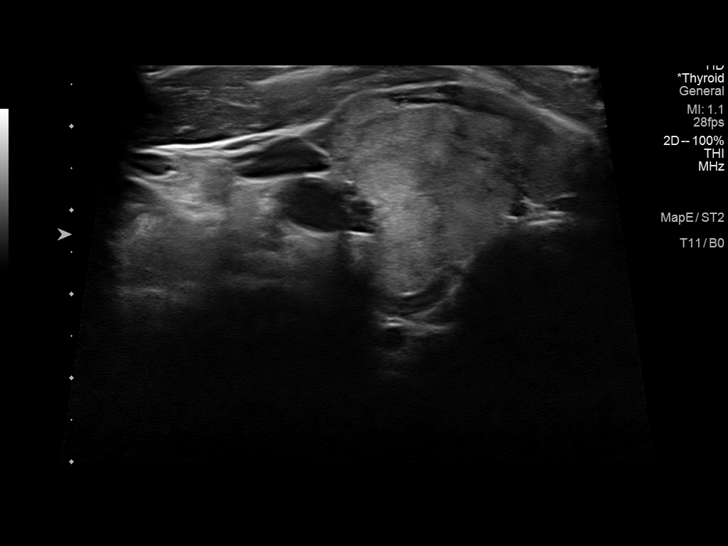
[im 17/45]
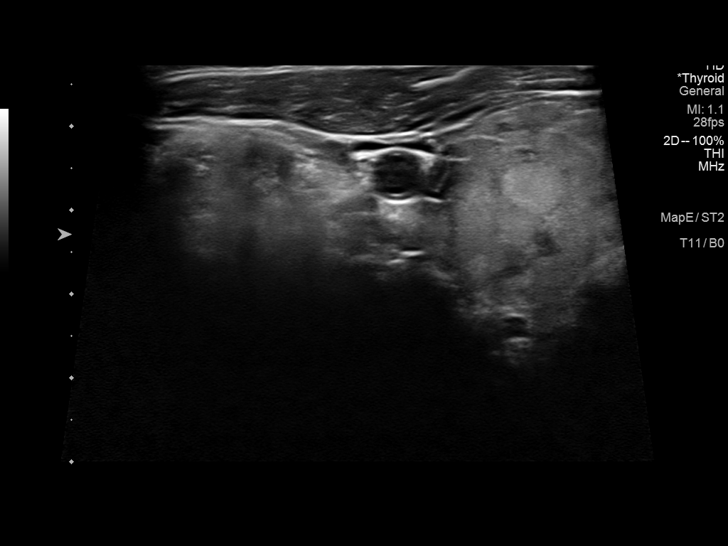
[im 21/45]
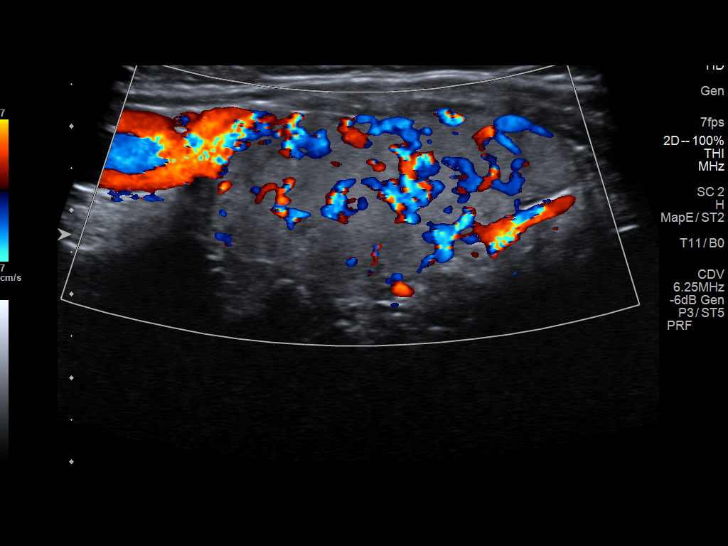
[im 24/45]
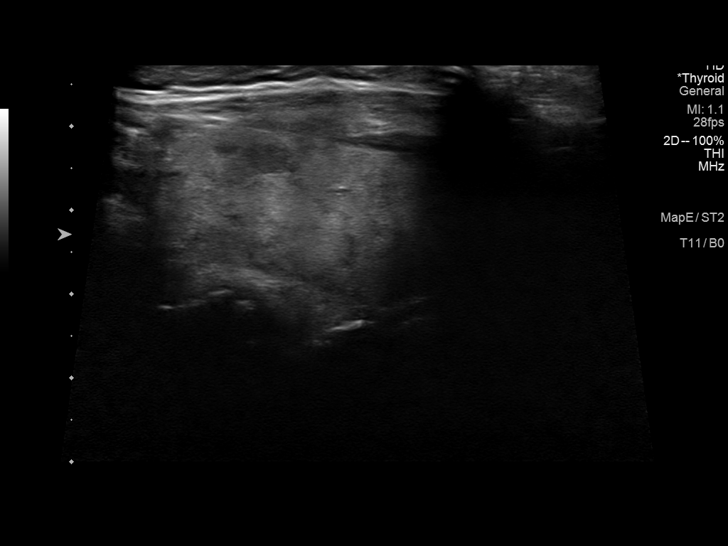
[im 28/45]
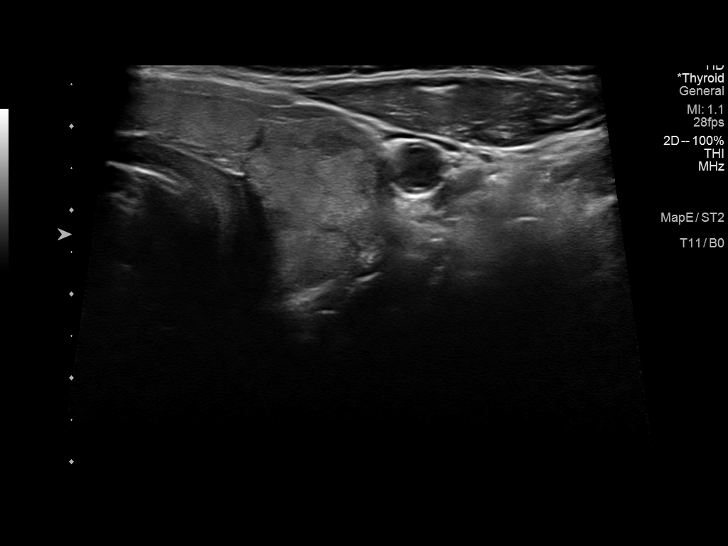
[im 30/45]
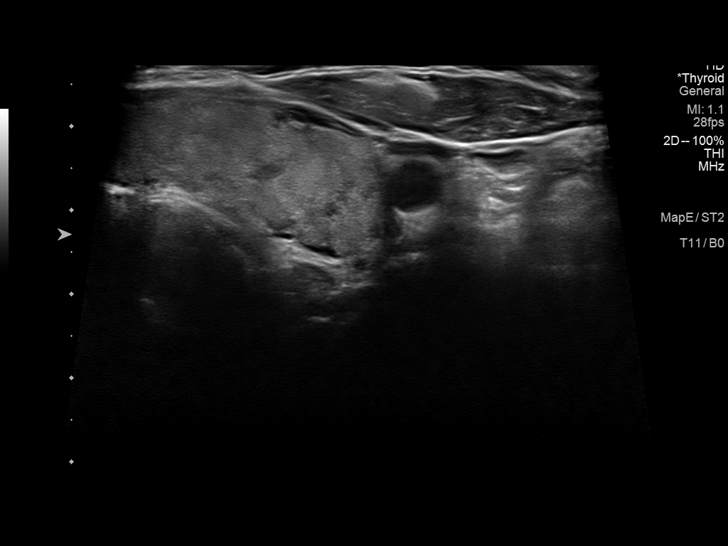
[im 34/45]
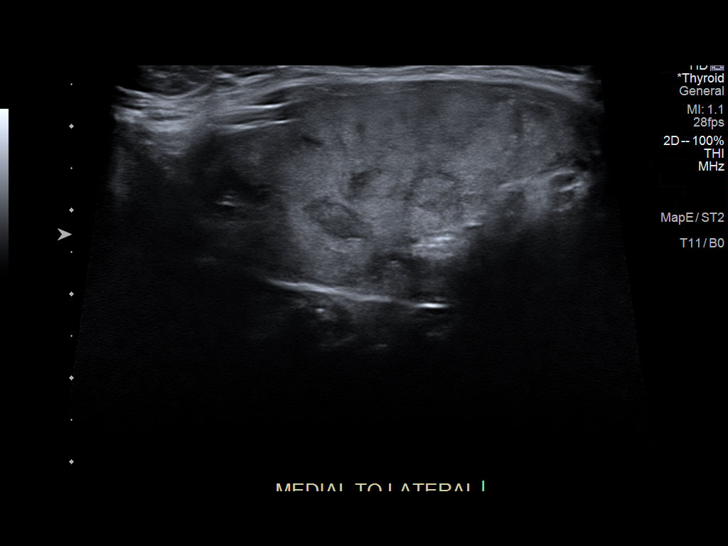
[im 37/45]
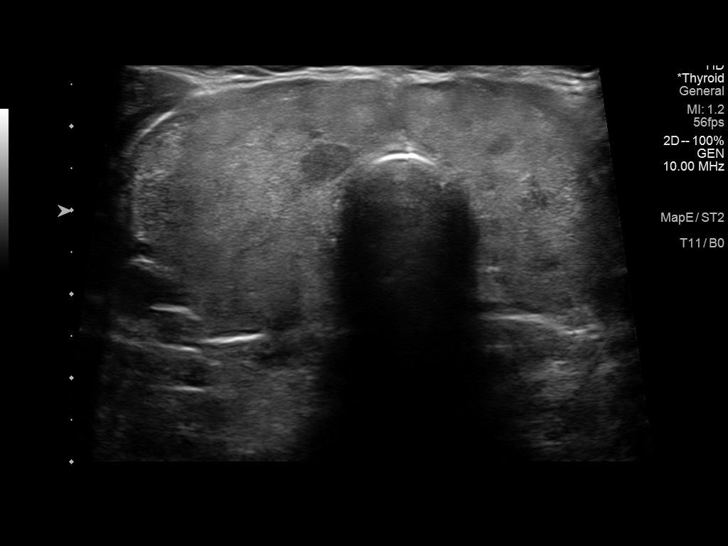
[im 41/45]
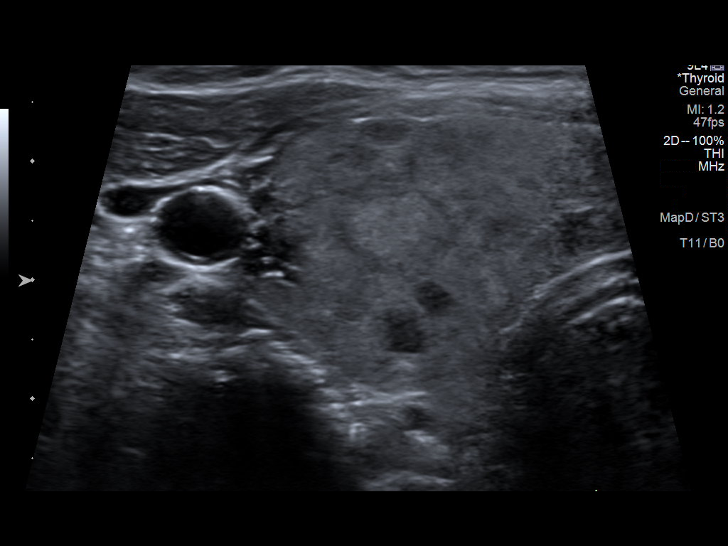
[im 45/45]
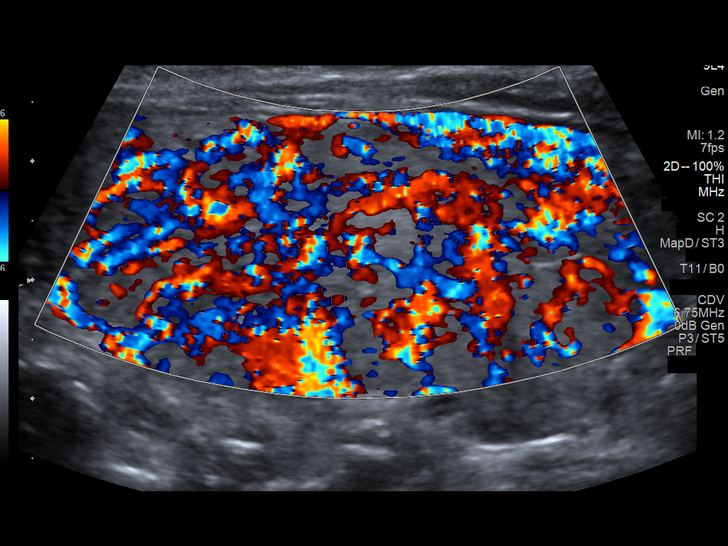

[14 of 25 positions shown; findings below may reference images not displayed]

FINDINGS: Parenchymal Echotexture: Moderately heterogeneous

Isthmus: 0.8 cm

Right lobe: 6.5 x 2.6 x 2.6 cm

Left lobe: 4.5 x 2.1 x 2.2 cm

_________________________________________________________

Estimated total number of nodules >/= 1 cm: 0

Number of spongiform nodules >/=  2 cm not described below (TR1): 0

Number of mixed cystic and solid nodules >/= 1.5 cm not described
below (TR2): 0

_________________________________________________________

No discrete nodules are seen within the thyroid gland.
IMPRESSION: Diffusely heterogeneous thyroid without discrete nodules.

The above is in keeping with the ACR TI-RADS recommendations - [HOSPITAL] 7104;[DATE].

## 2021-06-21 ENCOUNTER — Encounter: Payer: Self-pay | Admitting: Internal Medicine

## 2021-06-21 ENCOUNTER — Ambulatory Visit (INDEPENDENT_AMBULATORY_CARE_PROVIDER_SITE_OTHER): Payer: Self-pay | Admitting: Internal Medicine

## 2021-06-21 ENCOUNTER — Other Ambulatory Visit: Payer: Self-pay

## 2021-06-21 VITALS — BP 110/64 | HR 57 | Ht 63.0 in | Wt 155.0 lb

## 2021-06-21 DIAGNOSIS — E010 Iodine-deficiency related diffuse (endemic) goiter: Secondary | ICD-10-CM

## 2021-06-21 DIAGNOSIS — E063 Autoimmune thyroiditis: Secondary | ICD-10-CM

## 2021-06-21 LAB — TSH: TSH: 9.37 u[IU]/mL — ABNORMAL HIGH (ref 0.35–5.50)

## 2021-06-21 NOTE — Progress Notes (Signed)
Name: Glenda Smith  MRN/ DOB: [id number], 1970/12/12    Age/ Sex: 50 y.o., female     PCP: Patient, No Pcp Per (Inactive)   Reason for Endocrinology Evaluation: Hashimoto's Thyroiditis      Initial Endocrinology Clinic Visit: 08/03/2020    PATIENT IDENTIFIER: Glenda Smith is a 50 y.o., female with a past medical history of bipolar disroder and Hashimoto's Thyroiditis . She has followed with  Endocrinology clinic since 08/03/2020 for consultative assistance with management of her Hashimoto's Thyroiditis .   HISTORICAL SUMMARY:   Pt has been diagnosed with hashimoto's thyroid disease in 05/2020 with an elevated Anti-TPO Ab levels at > 600 IU/mL and an elevated TSH at 9.362 uIU/mL .     Of note the pt has been evaluated by neurology for intermittent episode of confusion, slurred speech  And eyes rolling to the back of the head.      She was put on prednisone for a short period of time.    The pt follows with Dr. Evelene Croon from psychiatry. She is on Trazodone for insomnia. She uses Adderall during the week during work hours. She is also on Xanax for anxiety. She was treated for bipolar disorder at some point.    IN the 1996  when she was diagnosed with hypothyroidism during pregnancy, she was started on Levothyroxine but was taken off after her labs were normal.    Thyroid ultrasound (07/2020) was unrevealing showing heterogenous gland without nodules      Paternal grandmother with Graves' disease  Has FH of diabetes  On her initial visit to our clinic she had a TSH of 9.362 uIU/mL and was restarted on LT-4 replacement.   SUBJECTIVE:     Today (06/21/2021):  Glenda Smith is here for Hashimoto's Thyroiditis.  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 Social History:  reports that she has quit smoking. She has never used smokeless tobacco. She reports current alcohol use. She reports that she does not currently use drugs. Family History:  Family History  Problem Relation Age of Onset   Depression Mother    Cancer Father        throat   Depression Sister      HOME MEDICATIONS: Allergies as of 06/21/2021   No Known Allergies      Medication List        Accurate as of June 21, 2021 10:37 AM. If you have any questions, ask your nurse or doctor.          STOP taking these medications    divalproex 500 MG 24 hr tablet Commonly known as: DEPAKOTE ER Stopped by: Scarlette Shorts, MD       TAKE these medications    amphetamine-dextroamphetamine 20 MG tablet Commonly known as: ADDERALL Take 20 mg by mouth 3 (three) times daily.   cholecalciferol 25 MCG (1000 UNIT) tablet Commonly known as: VITAMIN D3 Take 1,000 Units by mouth daily.   cloNIDine 0.1 MG tablet Commonly known as: CATAPRES Take 0.1-0.3 mg by mouth at bedtime.  cyanocobalamin 1000 MCG tablet Take 1 tablet (1,000 mcg total) by mouth daily.   doxepin 10 MG capsule Commonly known as: SINEQUAN Take 10-30 mg by mouth at bedtime.   levothyroxine 75 MCG tablet Commonly known as: SYNTHROID Take 1 tablet (75 mcg total) by mouth daily.   traZODone 50 MG tablet Commonly known as: DESYREL TAKE 3 TABLETS BY MOUTH AT BEDTIME          OBJECTIVE:   PHYSICAL EXAM: VS: BP 110/64 (BP Location: Left Arm, Patient Position: Sitting, Cuff Size: Small)   Pulse (!) 57   Ht 5\' 3"  (1.6 m)   Wt 155 lb (70.3 kg)   SpO2 91%   BMI 27.46 kg/m    EXAM: General: Pt appears well and is in NAD  Neck: General: Supple without adenopathy. Thyroid: Thyroid size  normal.  Left thyroid asymmetry noted   Lungs: Clear with good BS bilat with no rales, rhonchi, or wheezes  Heart: Auscultation: RRR.  Abdomen: Normoactive bowel sounds, soft, nontender, without masses or organomegaly palpable  Extremities:  BL LE: No pretibial edema normal ROM and strength.     DATA REVIEWED:  Latest Reference Range & Units 06/21/21 10:59  TSH 0.35 - 5.50 uIU/mL 9.37 (H)  (H): Data is abnormally high  Thyroid ULtrasound 08/18/2020  Diffusely heterogeneous thyroid without discrete nodules.  ASSESSMENT / PLAN / RECOMMENDATIONS:   Hashimoto's Thyroiditis :   - Pt continues with weight gain, insomnia, and fatue - She admits to imperfect adherence to LT-4 replacement  - - Pt educated extensively on the correct way to take levothyroxine (first thing in the morning with water, 30 minutes before eating or taking other medications). - Pt encouraged to double dose the following day if she were to miss a dose given long half-life of levothyroxine. - TSh worsening, will increase the dose and emphasize compliance   Medications  Stop Levothyroxine 75 mcg  Start Levothyroxine 100 mcg daily     F/U in 6 months  Labs in 8 weeks   Signed electronically by: 08/20/2020, MD  [facility]  [facility] [address] [place] [place], [place] Phone: [phone] FAX: [phone]      CC: Patient, No Pcp Per (Inactive) No address on file Phone: None  Fax: None   Return to Endocrinology clinic as below: No future appointments.

## 2021-06-22 ENCOUNTER — Telehealth: Payer: Self-pay | Admitting: Internal Medicine

## 2021-06-22 MED ORDER — LEVOTHYROXINE SODIUM 100 MCG PO TABS: 100.0000 ug | ORAL_TABLET | Freq: Every day | ORAL | 3 refills | Status: DC

## 2021-06-22 NOTE — Telephone Encounter (Signed)
Patient notified and verbalized understanding. 

## 2021-06-22 NOTE — Telephone Encounter (Signed)
Please let the patient know that her thyroid continues to be off.  Please asked the patient to stop levothyroxine 75 MCG daily and start 100 MCG daily   Prescription was sent to the pharmacy Thanks

## 2021-06-26 ENCOUNTER — Telehealth: Payer: Self-pay

## 2021-06-26 NOTE — Telephone Encounter (Signed)
Dr. Anne Hahn has retired. Dr. Vickey Huger will be this patient's attending provider at [facility].

## 2021-06-28 ENCOUNTER — Encounter: Payer: Self-pay | Admitting: Internal Medicine

## 2021-06-28 ENCOUNTER — Inpatient Hospital Stay: Admission: RE | Admit: 2021-06-28 | Payer: Self-pay | Source: Ambulatory Visit

## 2021-06-28 ENCOUNTER — Ambulatory Visit
Admission: RE | Admit: 2021-06-28 | Discharge: 2021-06-28 | Disposition: A | Discharge status: Home / Self Care | Payer: Self-pay | Source: Ambulatory Visit | Attending: Internal Medicine | Admitting: Internal Medicine

## 2021-06-28 DIAGNOSIS — E010 Iodine-deficiency related diffuse (endemic) goiter: Secondary | ICD-10-CM

## 2021-07-18 ENCOUNTER — Other Ambulatory Visit: Payer: Self-pay | Admitting: Internal Medicine

## 2021-08-21 ENCOUNTER — Other Ambulatory Visit (INDEPENDENT_AMBULATORY_CARE_PROVIDER_SITE_OTHER): Payer: Self-pay

## 2021-08-21 ENCOUNTER — Other Ambulatory Visit: Payer: Self-pay

## 2021-08-21 DIAGNOSIS — E063 Autoimmune thyroiditis: Secondary | ICD-10-CM

## 2021-08-21 LAB — TSH: TSH: 6.41 u[IU]/mL — ABNORMAL HIGH (ref 0.35–5.50)

## 2021-08-22 ENCOUNTER — Other Ambulatory Visit: Payer: Self-pay

## 2021-08-23 ENCOUNTER — Encounter: Payer: Self-pay | Admitting: Internal Medicine

## 2021-11-15 ENCOUNTER — Ambulatory Visit (INDEPENDENT_AMBULATORY_CARE_PROVIDER_SITE_OTHER): Payer: Self-pay | Admitting: Internal Medicine

## 2021-11-15 ENCOUNTER — Encounter: Payer: Self-pay | Admitting: Internal Medicine

## 2021-11-15 ENCOUNTER — Telehealth: Payer: Self-pay | Admitting: Internal Medicine

## 2021-11-15 VITALS — BP 102/68 | HR 72 | Ht 63.0 in | Wt 152.8 lb

## 2021-11-15 DIAGNOSIS — E063 Autoimmune thyroiditis: Secondary | ICD-10-CM

## 2021-11-15 LAB — T4, FREE: Free T4: 0.59 ng/dL — ABNORMAL LOW (ref 0.60–1.60)

## 2021-11-15 LAB — TSH: TSH: 5.59 u[IU]/mL — ABNORMAL HIGH (ref 0.35–5.50)

## 2021-11-15 NOTE — Telephone Encounter (Signed)
Called and left a detailed message on pt's vm advising pt her thyroid continues to be underactive and that she needs to take levothyroxine  DAILY , please let the patient know that not taking her thyroid makes her more tired and she is at risk for having a coma that specifically related to underactive thyroid. Pt was advised to call bac with any questions or concerns. ?

## 2021-11-15 NOTE — Progress Notes (Signed)
? ?Name: Glenda Smith  ?MRN/ DOB: [id number], April 25, 1971    ?Age/ Sex: 51 y.o., female   ? ? ?PCP: [facility]   ?Reason for Endocrinology Evaluation: Hashimoto's Thyroiditis   ?   ?Initial Endocrinology Clinic Visit: 08/03/2020  ? ? ?PATIENT IDENTIFIER: [name] is a 51 y.o., female with a past medical history of bipolar disroder and Hashimoto's Thyroiditis . She has followed with [facility] since 08/03/2020 for consultative assistance with management of her Hashimoto's Thyroiditis .  ? ?HISTORICAL SUMMARY:  ? ?Pt has been diagnosed with hashimoto's thyroid disease in 05/2020 with an elevated Anti-TPO Ab levels at > 600 IU/mL and an elevated TSH at 9.362 uIU/mL . ?  ?  ?Of note the pt has been evaluated by neurology for intermittent episode of confusion, slurred speech  ?And eyes rolling to the back of the head.  ?  ?  ?She was put on prednisone for a short period of time.  ?  ?The pt follows with Dr. Evelene Croon from psychiatry. She is on Trazodone for insomnia. She uses Adderall during the week during work hours. She is also on Xanax for anxiety. She was treated for bipolar disorder at some point.  ? ? ?IN the 1996  when she was diagnosed with hypothyroidism during pregnancy, she was started on Levothyroxine but was taken off after her labs were normal.  ?  ?Thyroid ultrasound (07/2020) was unrevealing showing heterogenous gland without nodules  ?  ?  ?Paternal grandmother with Graves' disease  ?Has FH of diabetes ? ?On her initial visit to our clinic she had a TSH of 9.362 uIU/mL and was restarted on LT-4 replacement.  ? ?SUBJECTIVE:  ? ? ? ?Today (11/15/2021):  Glenda Smith is here for Hashimoto's Thyroiditis. ? ?Weight has been stable  ?She has been forgetting to take her levothyroxine and does not double up on the dose  ?Denies local neck swelling  ?Continues with occasional  constipation  ?Denies palpitations  ?She is tired today as she has not slept well last night   ? ? ? ?Levothyroxine 100 mcg daily  ? ?HISTORY:  ?Past Medical History:  ?Past Medical History:  ?Diagnosis Date  ? Bipolar 1 disorder (HCC)   ? Chronic insomnia 06/02/2020  ? 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 Hypertension   ? Migraine   ? ?Past Surgical History: No past surgical history on file. ?Social History:  reports that she has quit smoking. She has never used smokeless tobacco. She reports current alcohol use. She reports that she does not currently use drugs. ?Family History:  ?Family History  ?Problem Relation Age of Onset  ? Depression Mother   ? Cancer Father   ?     throat  ? Depression Sister   ? ? ? ?HOME MEDICATIONS: ?Allergies as of 11/15/2021   ?No Known Allergies ?  ? ?  ?Medication List  ?  ? ?  ? Accurate as of November 15, 2021  4:48 PM. If you have any questions, ask your nurse or doctor.  ?  ?  ? ?  ? ?amphetamine-dextroamphetamine 20 MG tablet ?Commonly known as: ADDERALL ?Take 20 mg by mouth 3 (three) times daily. ?  ?cholecalciferol 25 MCG (1000 UNIT) tablet ?Commonly known as: VITAMIN D3 ?Take 1,000 Units by mouth daily. ?  ?cloNIDine 0.1 MG tablet ?Commonly known as: CATAPRES ?Take 0.1-0.3 mg by mouth at bedtime. ?  ?cyanocobalamin 1000 MCG tablet ?Take 1 tablet (1,000 mcg  total) by mouth daily. ?  ?doxepin 10 MG capsule ?Commonly known as: SINEQUAN ?Take 10-30 mg by mouth at bedtime. ?  ?levothyroxine 100 MCG tablet ?Commonly known as: SYNTHROID ?Take 1 tablet (100 mcg total) by mouth daily. ?  ?traZODone 50 MG tablet ?Commonly known as: DESYREL ?TAKE 3 TABLETS BY MOUTH AT BEDTIME ?  ? ?  ? ? ? ? ?OBJECTIVE:  ? ?PHYSICAL EXAM: ?VS: BP 102/68 (BP Location: Left Arm, Patient Position: Sitting, Cuff Size: Small)   Pulse 72   Ht 5\' 3"  (1.6 m)   Wt 152 lb 12.8 oz (69.3 kg)   SpO2 99%   BMI 27.07 kg/m?   ? ?EXAM: ?General: Pt appears well and is in NAD  ?Neck: General: Supple without adenopathy. ?Thyroid: Thyroid size normal.  Left thyroid asymmetry noted   ?Lungs: Clear with good BS  bilat with no rales, rhonchi, or wheezes  ?Heart: Auscultation: RRR.  ?Abdomen: Normoactive bowel sounds, soft, nontender, without masses or organomegaly palpable  ?Extremities:  ?BL LE: No pretibial edema normal ROM and strength.  ? ? ? ?DATA REVIEWED: ? Latest Reference Range & Units 11/15/21 10:54  ?TSH 0.35 - 5.50 uIU/mL 5.59 (H)  ?T4,Free(Direct) 0.60 - 1.60 ng/dL 11/17/21 (L)  ?(H): Data is abnormally high ?(L): Data is abnormally low ?Thyroid ULtrasound 08/18/2020 ? ?Diffusely heterogeneous thyroid without discrete nodules. ? ?ASSESSMENT / PLAN / RECOMMENDATIONS:  ? ?Hashimoto's Thyroiditis : ? ? ?- Pt continues with fatigue ?-She did take a trip with her daughter and forgot LT-for replacement ?-Emphasized the importance of compliance with LT-for replacement ?- - Pt educated extensively on the correct way to take levothyroxine (first thing in the morning with water, 30 minutes before eating or taking other medications). ?- Pt encouraged to double dose the following day if she were to miss a dose given long half-life of levothyroxine. ?-Patient was advised of the risk of myxedema coma ? ? ?Medications  ? ?Continue levothyroxine 100 mcg daily  ? ? ? ?F/U in 6 months  ?Repeat labs in 2 months ? ? ?Signed electronically by: ?Abby 08/20/2020, MD ? ?[facility]  ?[facility] ?[address] ?[place], [place] ?Phone: [phone] ?FAX: [phone]  ? ? ? ? ?CC: ?[phone] [facility] ?[address] ?[place] ?Phone: [phone]  ?Fax: [phone] ? ? ?Return to Endocrinology clinic as below: ?Future Appointments  ?Date Time Provider Department Center  ?11/13/2022 10:10 AM Yalitza Teed, 11/15/2022, MD [facility] None  ? ?  ? ?

## 2021-11-15 NOTE — Telephone Encounter (Signed)
Please let the patient know that her thyroid continues to be underactive and that she needs to take levothyroxine  DAILY , please let the patient know that not taking her thyroid makes her more tired and she is at risk for having a coma that specifically related to underactive thyroid ?

## 2021-11-15 NOTE — Patient Instructions (Signed)

## 2021-12-20 ENCOUNTER — Ambulatory Visit: Payer: Self-pay | Admitting: Internal Medicine

## 2022-01-17 ENCOUNTER — Emergency Department (HOSPITAL_BASED_OUTPATIENT_CLINIC_OR_DEPARTMENT_OTHER)
Admission: EM | Admit: 2022-01-17 | Discharge: 2022-01-17 | Disposition: A | Discharge status: Home / Self Care | Payer: No Typology Code available for payment source | Attending: Emergency Medicine | Admitting: Emergency Medicine

## 2022-01-17 ENCOUNTER — Emergency Department (HOSPITAL_BASED_OUTPATIENT_CLINIC_OR_DEPARTMENT_OTHER): Payer: No Typology Code available for payment source

## 2022-01-17 ENCOUNTER — Other Ambulatory Visit: Payer: Self-pay

## 2022-01-17 ENCOUNTER — Encounter (HOSPITAL_BASED_OUTPATIENT_CLINIC_OR_DEPARTMENT_OTHER): Payer: Self-pay | Admitting: Emergency Medicine

## 2022-01-17 DIAGNOSIS — G44309 Post-traumatic headache, unspecified, not intractable: Secondary | ICD-10-CM | POA: Insufficient documentation

## 2022-01-17 DIAGNOSIS — W1789XA Other fall from one level to another, initial encounter: Secondary | ICD-10-CM | POA: Insufficient documentation

## 2022-01-17 DIAGNOSIS — W19XXXA Unspecified fall, initial encounter: Secondary | ICD-10-CM

## 2022-01-17 DIAGNOSIS — S060X1A Concussion with loss of consciousness of 30 minutes or less, initial encounter: Secondary | ICD-10-CM | POA: Insufficient documentation

## 2022-01-17 DIAGNOSIS — R110 Nausea: Secondary | ICD-10-CM

## 2022-01-17 MED ORDER — ONDANSETRON HCL 4 MG PO TABS: 4.0000 mg | ORAL_TABLET | Freq: Three times a day (TID) | ORAL | 0 refills | Status: DC | PRN

## 2022-01-17 NOTE — Discharge Instructions (Addendum)
You were diagnosed today with a concussion.  Your head CT and neck CT were reassuring for no signs of acute fracture or abnormality.  I have provided follow-up information for the concussion clinic in [place].  Please schedule an appointment.  I have also prescribed nausea medication to be used as needed.

## 2022-01-17 NOTE — ED Provider Notes (Signed)
[facility] DE[facility] Provider Note   CSN: [id number] Arrival date & time: 01/17/22  1608     History  Chief Complaint  Patient presents with   Glenda Smith is a 51 y.o. female.  Patient presents to the hospital today complaining of headache, intermittent vision issues, nausea, vomiting, and intermittent dizziness since having a fall on June 23.  Patient states she fell out of a hammock backwards and hit the back of her head on concrete at that time.  She does endorse a brief period of loss of consciousness.  The patient denies taking blood thinners.  Past history significant for migraine, bipolar 1 disorder, hypertension, Hashimoto's encephalopathy, chronic insomnia  HPI     Home Medications Prior to Admission medications   Medication Sig Start Date End Date Taking? Authorizing Provider  ondansetron (ZOFRAN) 4 MG tablet Take 1 tablet (4 mg total) by mouth every 8 (eight) hours as needed for nausea or vomiting. 01/17/22  Yes Dorothyann Peng, PA-C  amphetamine-dextroamphetamine (ADDERALL) 20 MG tablet Take 20 mg by mouth 3 (three) times daily. 04/28/21   [provider]  cholecalciferol (VITAMIN D3) 25 MCG (1000 UNIT) tablet Take 1,000 Units by mouth daily.    [provider]  cloNIDine (CATAPRES) 0.1 MG tablet Take 0.1-0.3 mg by mouth at bedtime. 05/24/21   [provider]  doxepin (SINEQUAN) 10 MG capsule Take 10-30 mg by mouth at bedtime. 04/15/21   [provider]  levothyroxine (SYNTHROID) 100 MCG tablet Take 1 tablet (100 mcg total) by mouth daily. 06/22/21   Shamleffer, Melanie Crazier, MD  traZODone (DESYREL) 50 MG tablet TAKE 3 TABLETS BY MOUTH AT BEDTIME 12/20/20   Kathrynn Ducking, MD  vitamin B-12 1000 MCG tablet Take 1 tablet (1,000 mcg total) by mouth daily. 06/02/20   Aline August, MD      Allergies    Patient has no known allergies.    Review of Systems   Review of Systems  Constitutional:  Negative  for fever.  Eyes:  Positive for photophobia and visual disturbance.  Respiratory:  Negative for shortness of breath.   Cardiovascular:  Negative for chest pain.  Gastrointestinal:  Positive for nausea and vomiting. Negative for abdominal pain.  Neurological:  Positive for headaches.    Physical Exam Updated Vital Signs BP 115/82   Pulse (!) 59   Temp 98.7 F (37.1 C) (Oral)   Resp 18   Ht 5\' 3"  (1.6 m)   Wt 68 kg   SpO2 100%   BMI 26.57 kg/m  Physical Exam Vitals and nursing note reviewed.  Constitutional:      General: She is not in acute distress. HENT:     Head: Normocephalic and atraumatic.     Mouth/Throat:     Mouth: Mucous membranes are moist.  Eyes:     Extraocular Movements: Extraocular movements intact.     Conjunctiva/sclera: Conjunctivae normal.     Pupils: Pupils are equal, round, and reactive to light.  Neck:     Comments: Tenderness to palpation midline cervical spine Cardiovascular:     Rate and Rhythm: Normal rate.  Pulmonary:     Effort: Pulmonary effort is normal.  Musculoskeletal:        General: No deformity.     Cervical back: Normal range of motion and neck supple.  Skin:    General: Skin is warm and dry.  Neurological:     Mental Status: She is alert.  Sensory: Sensation is intact.     Motor: Motor function is intact.     Coordination: Heel to Shin Test normal.     Comments: CN II through VII, XI, XII grossly intact     ED Results / Procedures / Treatments   Labs (all labs ordered are listed, but only abnormal results are displayed) Labs Reviewed - No data to display  EKG None  Radiology CT Cervical Spine Wo Contrast  Result Date: 01/17/2022 CLINICAL DATA:  Trauma, neck injury, fall from [place] EXAM: CT CERVICAL SPINE WITHOUT CONTRAST TECHNIQUE: Multidetector CT imaging of the cervical spine was performed without intravenous contrast. Multiplanar CT image reconstructions were also generated. RADIATION DOSE REDUCTION: This exam  was performed according to the departmental dose-optimization program which includes automated exposure control, adjustment of the mA and/or kV according to patient size and/or use of iterative reconstruction technique. COMPARISON:  None Available. FINDINGS: Alignment: Mild cervical kyphotic curvature appearing secondary to multilevel degenerative change and extensive multilevel posterior facet arthropathy. Facets appear aligned. No subluxation or dislocation. Skull base and vertebrae: No acute fracture. No primary bone lesion or focal pathologic process. Soft tissues and spinal canal: No prevertebral fluid or swelling. No visible canal hematoma. Disc levels: Multilevel cervical degenerative change but most severe at C5-6 and C6-7 where there is marked disc space narrowing, endplate sclerosis and osteophyte formation. Advanced degenerative changes of the C1-2 articulation. Extensive posterior facet arthropathy. Upper chest: Diffuse thyroid enlargement. Please refer to the prior thyroid ultrasounds. Other: None. IMPRESSION: Multilevel cervical spondylosis and facet arthropathy. No acute osseous finding, fracture or malalignment by CT. Electronically Signed   By: Judie Petit.  Shick M.D.   On: 01/17/2022 17:31   CT Head Wo Contrast  Result Date: 01/17/2022 CLINICAL DATA:  Recent fall from [place], head and neck injury, headache and dizziness EXAM: CT HEAD WITHOUT CONTRAST TECHNIQUE: Contiguous axial images were obtained from the base of the skull through the vertex without intravenous contrast. RADIATION DOSE REDUCTION: This exam was performed according to the departmental dose-optimization program which includes automated exposure control, adjustment of the mA and/or kV according to patient size and/or use of iterative reconstruction technique. COMPARISON:  05/25/2020 FINDINGS: Brain: No evidence of acute infarction, hemorrhage, hydrocephalus, extra-axial collection or mass lesion/mass effect. Vascular: No hyperdense  vessel or unexpected calcification. Skull: Normal. Negative for fracture or focal lesion. Sinuses/Orbits: No acute finding. Other: None. IMPRESSION: Normal head CT without contrast Electronically Signed   By: Judie Petit.  Shick M.D.   On: 01/17/2022 17:26    Procedures Procedures    Medications Ordered in ED Medications - No data to display  ED Course/ Medical Decision Making/ A&P                           Medical Decision Making Amount and/or Complexity of Data Reviewed Radiology: ordered.   Patient presents with headache, nausea, vomiting, vision changes since experienced a fall with head injury 5 days ago.  Differential includes but is not limited to concussion, intracranial abnormalities, migraine, and others  Patient does have history of migraines which complicates her visit  I ordered and interpreted imaging including head CT and CT cervical spine without contrast.  No intracranial abnormality noted.  No fracture noted in cervical spine.  I agree with radiologist findings.  Based on the patient's presentation I feel this is most likely concussion.  Her neuro exam is grossly normal.  She has no intracranial abnormality on exam and this  is not consistent with previous migraines.  I will refer the patient to the concussion clinic and we will prescribe Zofran for the patient's nausea.  Patient will provided education materials on concussions.  Return precautions provided        Final Clinical Impression(s) / ED Diagnoses Final diagnoses:  Concussion with loss of consciousness of 30 minutes or less, initial encounter  Nausea  Post-traumatic headache, not intractable, unspecified chronicity pattern  Fall, initial encounter    Rx / DC Orders ED Discharge Orders          Ordered    ondansetron (ZOFRAN) 4 MG tablet  Every 8 hours PRN        01/17/22 1801              Pamala Duffel 01/17/22 1840    Milagros Loll, MD 01/17/22 2025

## 2022-01-17 NOTE — ED Notes (Signed)
Discharge instructions, follow up care, and prescription\ reviewed and explained, pt verbalized understanding. Pt caox4 and ambulatory on departure.  

## 2022-01-17 NOTE — ED Triage Notes (Signed)
Pt arrives to ED with c/o fall. This occurred on 6/23. Pt reports she fell backwards on a hammock and hit the back oh her head concrete. Since the injury pt has been experiencing blurry vision, nausea, vomiting, headache, and dizziness.

## 2022-01-29 NOTE — Progress Notes (Deleted)
Aleen Sells D.[facility] [address] [place] [address] Phone: [phone]  Assessment and Plan:     There are no diagnoses linked to this encounter.      Date of injury was 01/12/2022. Original symptom severity scores were *** and ***. The patient was counseled on the nature of the injury, typical course and potential options for further evaluation and treatment. Discussed the importance of compliance with recommendations. Patient stated understanding of this plan and willingness to comply.  Recommendations:  -  Complete mental and physical rest for 48 hours after concussive event - Recommend light aerobic activity while keeping symptoms less than 3/10 - Stop mental or physical activities that cause symptoms to worsen greater than 3/10, and wait 24 hours before attempting them again - Eliminate screen time as much as possible for first 48 hours after concussive event, then continue limited screen time (recommend less than 2 hours per day)   - Encouraged to RTC in *** for reassessment or sooner for any concerns or acute changes   Pertinent previous records reviewed include ***   Time of visit *** minutes, which included chart review, physical exam, treatment plan, symptom severity score, VOMS, and tandem gait testing being performed, interpreted, and discussed with patient at today's visit.   Subjective:   I, Glenda Smith, am serving as a Neurosurgeon for Doctor Richardean Sale  Chief Complaint: concussion symptoms   HPI:   01/30/2022   Glenda Smith is a 51 y.o. female.  Patient presents to the hospital today complaining of headache, intermittent vision issues, nausea, vomiting, and intermittent dizziness since having a fall on June 23.  Patient states she fell out of a hammock backwards and hit the back of her head on concrete at that time.  She does endorse a brief period of loss of consciousness.  The patient denies taking blood thinners.  Past  history significant for migraine, bipolar 1 disorder, hypertension, Hashimoto's encephalopathy, chronic insomnia   Concussion HPI:  - Injury date: 01/12/2022   - Mechanism of injury: fall   - LOC: yes   - Initial evaluation: ED  - Previous head injuries/concussions: ***   - Previous imaging: ***    - Social history: Student at ***, activities include ***    Hospitalization for head injury? No*** Diagnosed/treated for headache disorder or migraines? No*** Diagnosed with learning disability Glenda Smith? No*** Diagnosed with ADD/ADHD? No*** Diagnose with Depression, anxiety, or other Psychiatric Disorder? No***   Current medications:  Current Outpatient Medications  Medication Sig Dispense Refill   amphetamine-dextroamphetamine (ADDERALL) 20 MG tablet Take 20 mg by mouth 3 (three) times daily.     cholecalciferol (VITAMIN D3) 25 MCG (1000 UNIT) tablet Take 1,000 Units by mouth daily.     cloNIDine (CATAPRES) 0.1 MG tablet Take 0.1-0.3 mg by mouth at bedtime.     doxepin (SINEQUAN) 10 MG capsule Take 10-30 mg by mouth at bedtime.     levothyroxine (SYNTHROID) 100 MCG tablet Take 1 tablet (100 mcg total) by mouth daily. 90 tablet 3   ondansetron (ZOFRAN) 4 MG tablet Take 1 tablet (4 mg total) by mouth every 8 (eight) hours as needed for nausea or vomiting. 20 tablet 0   traZODone (DESYREL) 50 MG tablet TAKE 3 TABLETS BY MOUTH AT BEDTIME 90 tablet 0   vitamin B-12 1000 MCG tablet Take 1 tablet (1,000 mcg total) by mouth daily. 30 tablet 0   No current facility-administered medications for this visit.  Objective:     There were no vitals filed for this visit.    There is no height or weight on file to calculate BMI.    Physical Exam:     General: Well-appearing, cooperative, sitting comfortably in no acute distress.  Psychiatric: Mood and affect are appropriate.     Today's Symptom Severity Score:  Scores: 0-6  Headache:*** "Pressure in head":***  Neck Pain:***  Nausea or  vomiting:***  Dizziness:***  Blurred vision:***  Balance problems:***  Sensitivity to light:***  Sensitivity to noise:***  Feeling slowed down:***  Feeling like "in a fog":***  "Don't feel right":***  Difficulty concentrating:***  Difficulty remembering:***  Fatigue or low energy:***  Confusion:***  Drowsiness:***  More emotional:***  Irritability:***  Sadness:***  Nervous or Anxious:***  Trouble falling asleep:***   Total number of symptoms: ***/22  Symptom Severity index: ***/132  Worse with physical activity? No*** Worse with mental activity? No*** Percent improved since injury: ***%    Full pain-free cervical PROM: yes***    Tandem gait: - Forward, eyes open: *** errors - Backward, eyes open: *** errors - Forward, eyes closed: *** errors - Backward, eyes closed: *** errors  VOMS:   - Baseline symptoms: *** - Horizontal Vestibular-Ocular Reflex: ***/10  - Vertical Vestibular-Ocular Reflex: ***/10  - Smooth pursuits: ***/10  - Horizontal Saccades:  ***/10  - Vertical Saccades: ***/10  - Visual Motion Sensitivity Test:  ***/10  - Convergence: ***cm (<5 cm normal)     Electronically signed by:  Aleen Sells [facility] 11:31 AM 01/29/22

## 2022-01-30 ENCOUNTER — Ambulatory Visit: Payer: No Typology Code available for payment source | Admitting: Sports Medicine

## 2022-04-19 IMAGING — US US THYROID
1 series · 14 of 25 positions shown · non-contrast
Comparison: 08/18/2020

CLINICAL DATA: Left thyroid nodule palpated on physical
examination.

EXAM:
THYROID ULTRASOUND
TECHNIQUE: Ultrasound examination of the thyroid gland and adjacent soft
tissues was performed.

[Series 1: us thyroid · 0.07mm/px · 38 acquisitions, 14 frames shown]
[im 1/38]
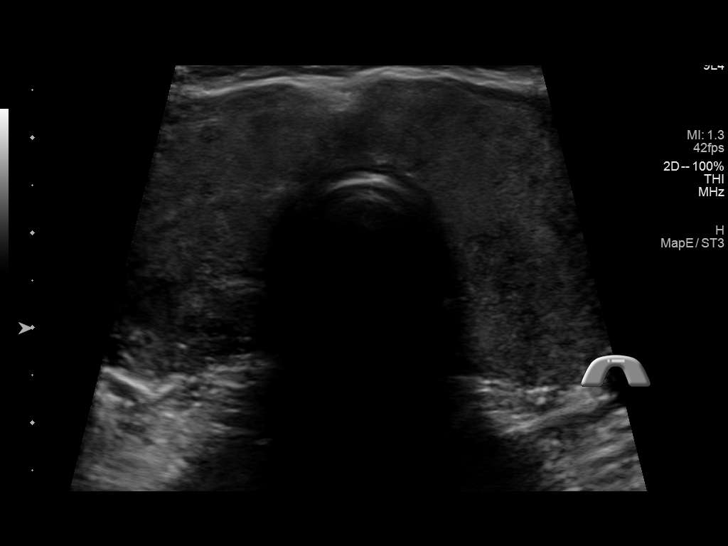
[im 4/38]
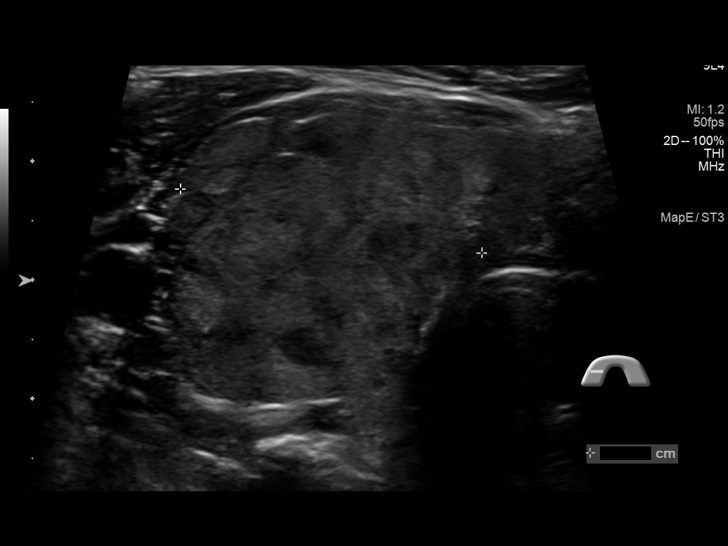
[im 7/38]
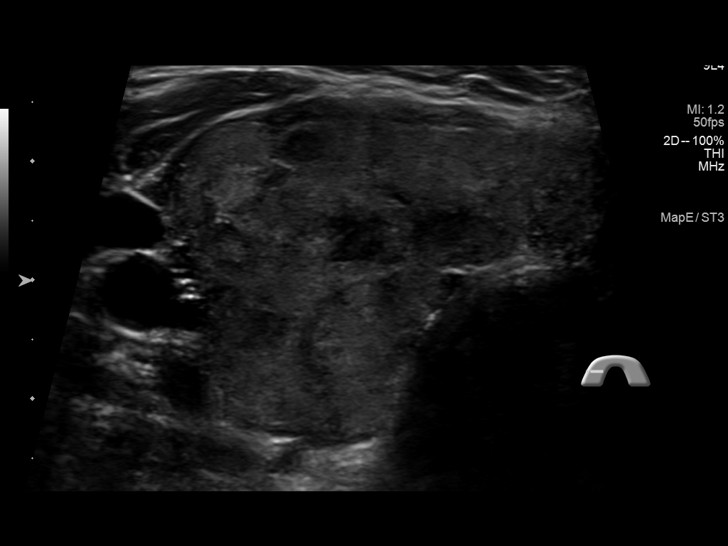
[im 10/38]
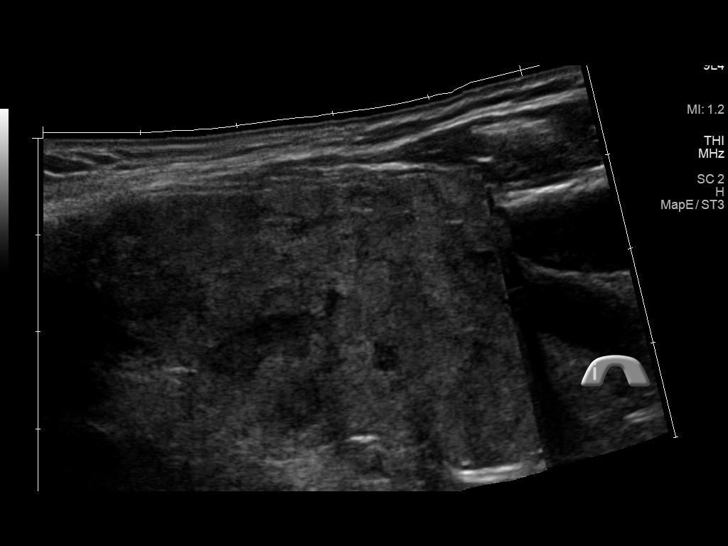
[im 13/38]
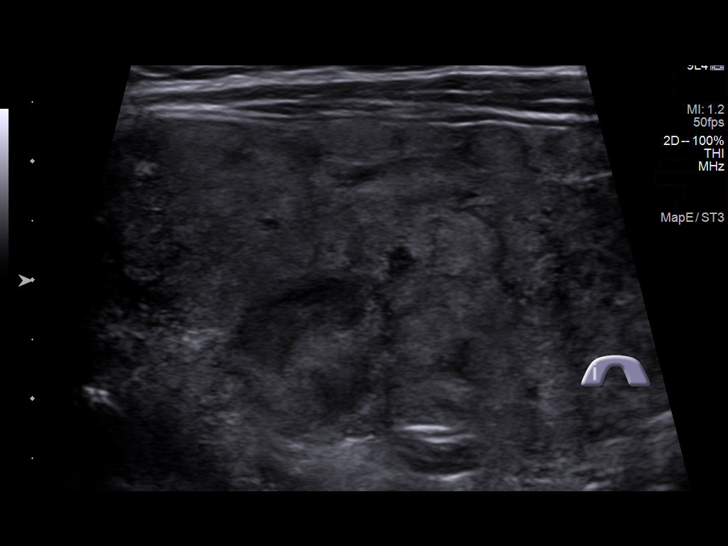
[im 14/38]
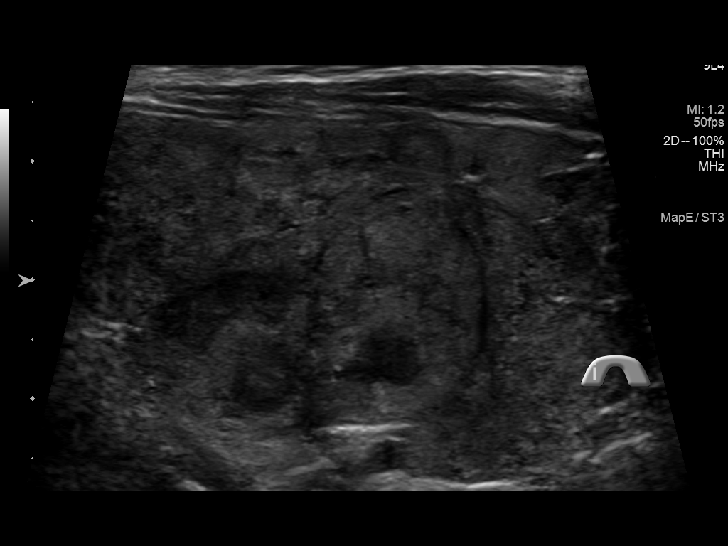
[im 17/38]
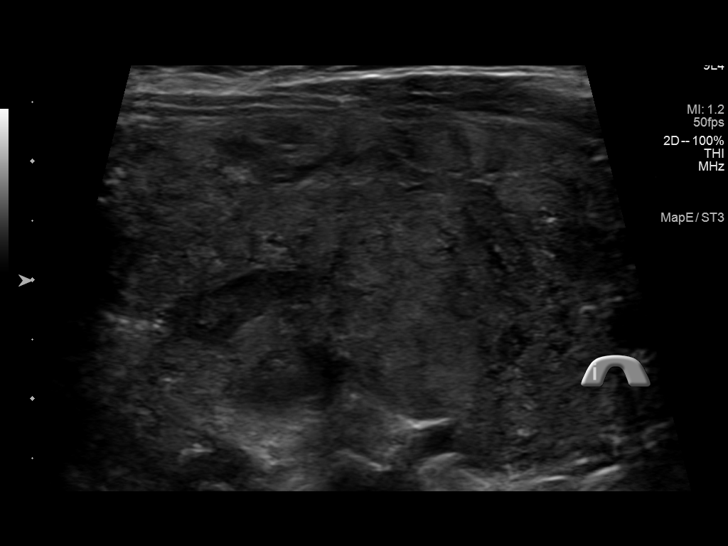
[im 21/38]
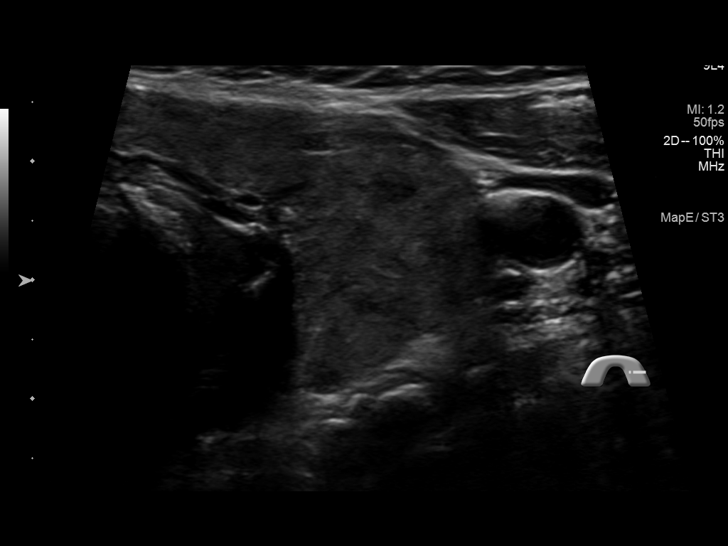
[im 24/38]
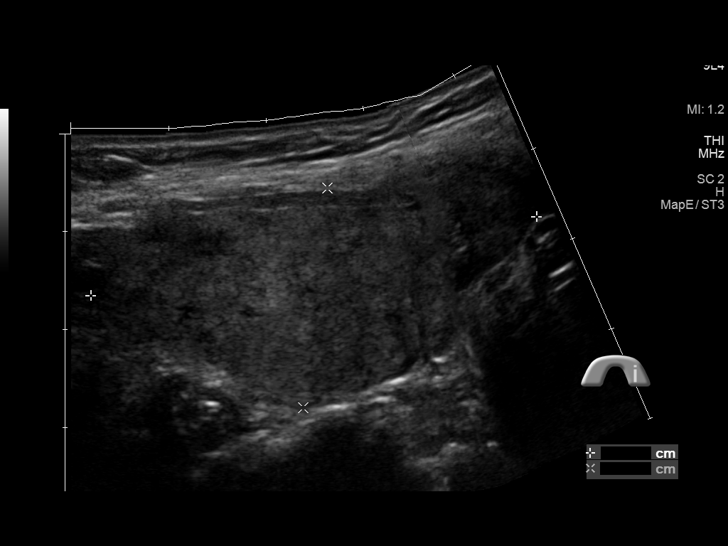
[im 25/38]
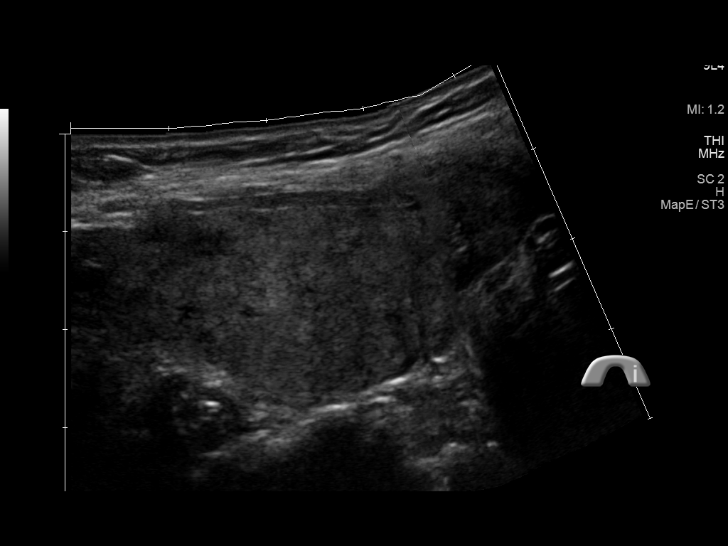
[im 28/38]
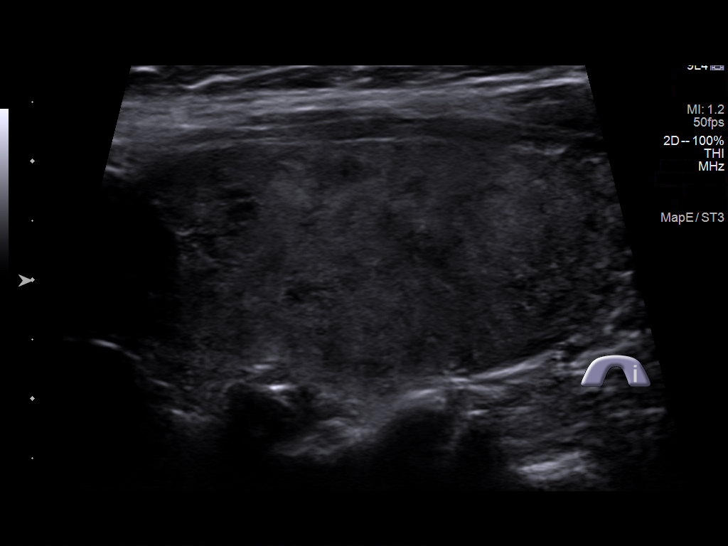
[im 31/38]
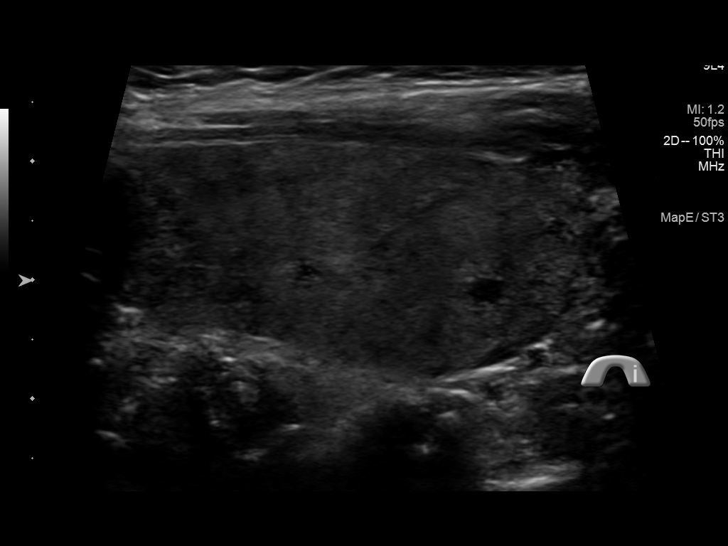
[im 34/38]
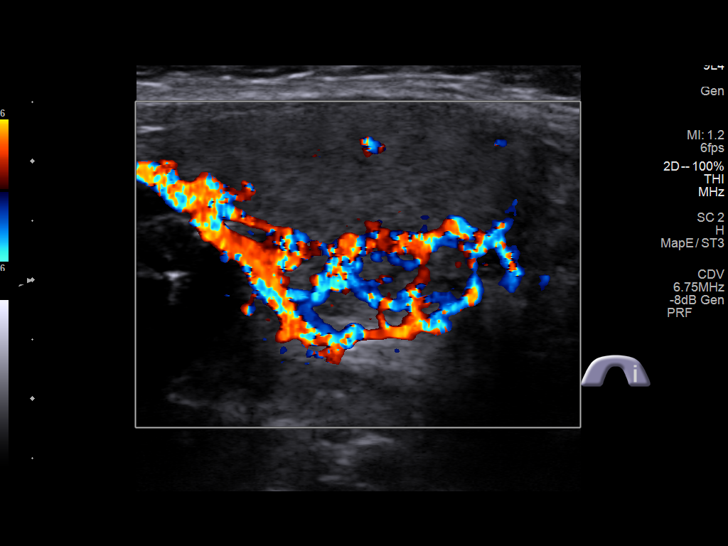
[im 38/38]
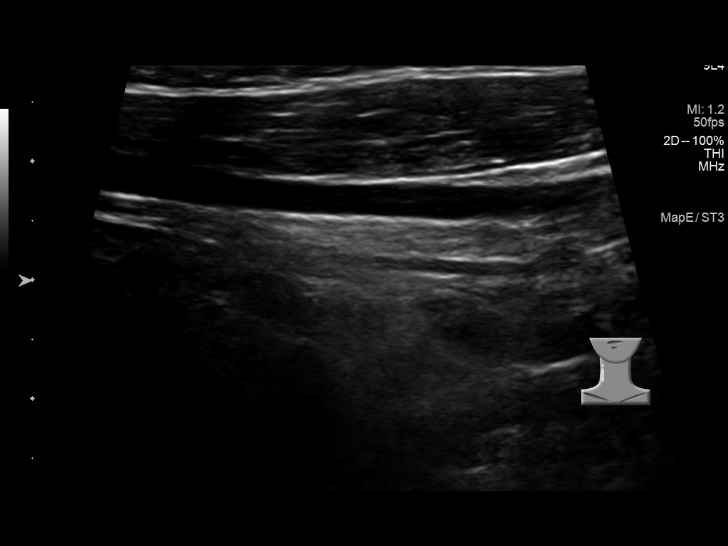

[14 of 25 positions shown; findings below may reference images not displayed]

FINDINGS: Parenchymal Echotexture: Moderately heterogeneous

Isthmus: 0.9 cm

Right lobe: 5.5 x 3.1 x 2.6 cm

Left lobe: 4.6 x 2.3 x 2.5 cm

_________________________________________________________

Estimated total number of nodules >/= 1 cm: 0

Number of spongiform nodules >/=  2 cm not described below (TR1): 0

Number of mixed cystic and solid nodules >/= 1.5 cm not described
below (TR2): 0

_________________________________________________________

No discrete nodules are seen within the thyroid gland.
IMPRESSION: Moderate diffuse heterogeneity of the thyroid without discrete
nodule.

The above is in keeping with the ACR TI-RADS recommendations - [HOSPITAL] 8285;[DATE].

## 2022-11-13 ENCOUNTER — Ambulatory Visit (INDEPENDENT_AMBULATORY_CARE_PROVIDER_SITE_OTHER): Payer: Self-pay | Admitting: Internal Medicine

## 2022-11-13 ENCOUNTER — Encounter: Payer: Self-pay | Admitting: Internal Medicine

## 2022-11-13 VITALS — BP 124/80 | HR 81 | Ht 63.0 in | Wt 152.3 lb

## 2022-11-13 DIAGNOSIS — E063 Autoimmune thyroiditis: Secondary | ICD-10-CM

## 2022-11-13 MED ORDER — LEVOTHYROXINE SODIUM 100 MCG PO TABS: 100.0000 ug | ORAL_TABLET | Freq: Every day | ORAL | 2 refills | Status: DC

## 2022-11-13 NOTE — Patient Instructions (Signed)

## 2022-11-13 NOTE — Progress Notes (Signed)
Name: Glenda Smith  MRN/ DOB: [id number], [birth year]    Age/ Sex: 52 y.o., female     PCP: [facility]   Reason for Endocrinology Evaluation: Hashimoto's Thyroiditis      Initial Endocrinology Clinic Visit: 08/03/2020    PATIENT IDENTIFIER: Glenda Smith is a 52 y.o., female with a past medical history of bipolar disroder and Hashimoto's Thyroiditis . She has followed with [facility] since 08/03/2020 for consultative assistance with management of her Hashimoto's Thyroiditis .   HISTORICAL SUMMARY:   Pt has been diagnosed with hashimoto's thyroid disease in 05/2020 with an elevated Anti-TPO Ab levels at > 600 IU/mL and an elevated TSH at 9.362 uIU/mL .     Of note the pt has been evaluated by neurology for intermittent episode of confusion, slurred speech  And eyes rolling to the back of the head.      She was put on prednisone for a short period of time.    The pt was following with  Dr. Evelene Croon from psychiatry.She uses Adderall during the week during work hours. She is also on Xanax for anxiety. She was treated for bipolar disorder at some point.    IN the 1996  when she was diagnosed with hypothyroidism during pregnancy, she was started on Levothyroxine but was taken off after her labs were normal.    Thyroid ultrasound (07/2020) was unrevealing showing heterogenous gland without nodules      Paternal grandmother with Graves' disease  Has FH of diabetes  On her initial visit to our clinic she had a TSH of 9.362 uIU/mL and was restarted on LT-4 replacement.   SUBJECTIVE:     Today (11/13/2022):  Glenda Smith is here for Hashimoto's Thyroiditis.  Weight has been stable  She had ran out of Levothyroxine for ~ 1 week Uses pill box   Denies local neck swelling  Denies constipation or diarrhea   Denies palpitations    Levothyroxine 100 mcg daily   HISTORY:  Past Medical History:  Past Medical History:  Diagnosis Date    Bipolar 1 disorder    Chronic insomnia 06/02/2020   Hashimoto's encephalopathy 06/02/2020   Hypertension    Migraine    Past Surgical History: No past surgical history on file. Social History:  reports that she has quit smoking. She has never used smokeless tobacco. She reports current alcohol use. She reports that she does not currently use drugs. Family History:  Family History  Problem Relation Age of Onset   Depression Mother    Cancer Father        throat   Depression Sister      HOME MEDICATIONS: Allergies as of 11/13/2022   No Known Allergies      Medication List        Accurate as of November 13, 2022 10:25 AM. If you have any questions, ask your nurse or doctor.          amphetamine-dextroamphetamine 20 MG tablet Commonly known as: ADDERALL Take 20 mg by mouth 3 (three) times daily.   cholecalciferol 25 MCG (1000 UNIT) tablet Commonly known as: VITAMIN D3 Take 1,000 Units by mouth daily.   cloNIDine 0.1 MG tablet Commonly known as: CATAPRES Take 0.1-0.3 mg by mouth at bedtime.   cyanocobalamin 1000 MCG tablet Take 1 tablet (1,000 mcg total) by mouth daily.   doxepin 10 MG capsule Commonly known as: SINEQUAN Take 10-30 mg by mouth at bedtime.  levothyroxine 100 MCG tablet Commonly known as: SYNTHROID Take 1 tablet (100 mcg total) by mouth daily.   ondansetron 4 MG tablet Commonly known as: Zofran Take 1 tablet (4 mg total) by mouth every 8 (eight) hours as needed for nausea or vomiting.   traZODone 100 MG tablet Commonly known as: DESYREL Take 300 mg by mouth at bedtime as needed. What changed: Another medication with the same name was removed. Continue taking this medication, and follow the directions you see here. Changed by: Scarlette Shorts, MD          OBJECTIVE:   PHYSICAL EXAM: VS: BP 124/80 (BP Location: Left Arm, Patient Position: Sitting, Cuff Size: Small)   Pulse 81   Ht  (1.6 m)   Wt 152 lb 4.8 oz (69.1 kg)    SpO2 98%   BMI 26.98 kg/m    EXAM: General: Pt appears well and is in NAD  Neck: General: Supple without adenopathy. Thyroid: Thyroid size normal.   Lungs: Clear with good BS bilat with no rales, rhonchi, or wheezes  Heart: Auscultation: RRR.  Abdomen:  soft, nontender  Extremities:  BL LE: No pretibial edema normal ROM and strength.     DATA REVIEWED:  Latest Reference Range & Units 11/15/21 10:54  TSH 0.35 - 5.50 uIU/mL 5.59 (H)  T4,Free(Direct) 0.60 - 1.60 ng/dL 7.82 (L)      Thyroid ULtrasound 08/18/2020  Diffusely heterogeneous thyroid without discrete nodules.  ASSESSMENT / PLAN / RECOMMENDATIONS:   Hashimoto's Thyroiditis :   - She has been without levothyroxine for a week - We opted not to check TSH today and she will return in 8 weeks for TSH recheck   - Pt educated extensively on the correct way to take levothyroxine (first thing in the morning with water, 30 minutes before eating or taking other medications). - Pt encouraged to double dose the following day if she were to miss a dose given long half-life of levothyroxine.   Medications   Continue levothyroxine 100 mcg daily     F/U in 1 yr  Repeat labs in 2 months   Signed electronically by: Lyndle Herrlich, MD  [facility]  [facility] [address] [place] [place], [place] [address] Phone: [phone] FAX: [phone]      CC: [facility] [address] [place] [address] Phone: [phone]  Fax: [phone]   Return to Endocrinology clinic as below: No future appointments.

## 2022-12-19 ENCOUNTER — Other Ambulatory Visit: Payer: Self-pay

## 2023-08-07 ENCOUNTER — Other Ambulatory Visit: Payer: Self-pay | Admitting: Internal Medicine

## 2023-11-07 ENCOUNTER — Other Ambulatory Visit: Payer: Self-pay | Admitting: Internal Medicine

## 2023-11-11 ENCOUNTER — Ambulatory Visit: Payer: Self-pay | Admitting: Internal Medicine

## 2023-11-11 NOTE — Progress Notes (Deleted)
 Name: Glenda Smith  MRN/ DOB: [id number], 08/04/1970    Age/ Sex: 53 y.o., female     PCP: [facility]   Reason for Endocrinology Evaluation: Hashimoto's Thyroiditis      Initial Endocrinology Clinic Visit: 08/03/2020    PATIENT IDENTIFIER: Glenda Smith is a 53 y.o., female with a past medical history of bipolar disroder and Hashimoto's Thyroiditis . She has followed with [facility] since 08/03/2020 for consultative assistance with management of her Hashimoto's Thyroiditis .   HISTORICAL SUMMARY:   Pt has been diagnosed with hashimoto's thyroid disease in 05/2020 with an elevated Anti-TPO Ab levels at > 600 IU/mL and an elevated TSH at 9.362 uIU/mL .     Of note the pt has been evaluated by neurology for intermittent episode of confusion, slurred speech  And eyes rolling to the back of the head.      She was put on prednisone  for a short period of time.    The pt was following with  Dr. Deborra Falter from psychiatry.She uses Adderall during the week during work hours. She is also on Xanax for anxiety. She was treated for bipolar disorder at some point.    IN the 1996  when she was diagnosed with hypothyroidism during pregnancy, she was started on Levothyroxine  but was taken off after her labs were normal.    Thyroid ultrasound (07/2020) was unrevealing showing heterogenous gland without nodules      Paternal grandmother with Graves' disease  Has FH of diabetes  On her initial visit to our clinic she had a TSH of 9.362 uIU/mL and was restarted on LT-4 replacement.   SUBJECTIVE:     Today (11/11/2023):  Ms. Frangos is here for Hashimoto's Thyroiditis.  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 Social History:  reports that she has quit smoking. She has never used smokeless tobacco. She reports current alcohol use. She reports that she does not currently use drugs. Family History:  Family History  Problem Relation Age of Onset   Depression Mother    Cancer Father        throat   Depression Sister      HOME MEDICATIONS: Allergies as of 11/11/2023   No Known Allergies      Medication List        Accurate as of November 11, 2023  7:01 AM. If you have any questions, ask your nurse or doctor.          amphetamine-dextroamphetamine 20 MG tablet Commonly known as: ADDERALL Take 20 mg by mouth 3 (three) times daily.   cholecalciferol 25 MCG (1000 UNIT) tablet Commonly known as: VITAMIN D3 Take 1,000 Units by mouth daily.   cloNIDine 0.1 MG tablet Commonly known as: CATAPRES Take 0.1-0.3 mg by mouth at bedtime.   cyanocobalamin  1000 MCG tablet Take 1 tablet (1,000 mcg total) by mouth daily.   doxepin 10 MG capsule Commonly known as: SINEQUAN Take 10-30 mg by mouth at bedtime.  levothyroxine  100 MCG tablet Commonly known as: SYNTHROID  Take 1 tablet by mouth once daily   ondansetron  4 MG tablet Commonly known as: Zofran  Take 1 tablet (4 mg total) by mouth every 8 (eight) hours as needed for nausea or vomiting.   traZODone  100 MG tablet Commonly known as: DESYREL  Take 300 mg by mouth at bedtime as needed.          OBJECTIVE:   PHYSICAL EXAM: VS: There were no vitals taken for this visit.   EXAM: General: Pt appears well and is in NAD  Neck: General: Supple without adenopathy. Thyroid: Thyroid size normal.   Lungs: Clear with good BS bilat with no rales, rhonchi, or wheezes  Heart: Auscultation: RRR.  Abdomen:  soft, nontender  Extremities:  BL LE: No  pretibial edema normal ROM and strength.     DATA REVIEWED:  Latest Reference Range & Units 11/15/21 10:54  TSH 0.35 - 5.50 uIU/mL 5.59 (H)  T4,Free(Direct) 0.60 - 1.60 ng/dL 4.09 (L)      Thyroid ULtrasound 08/18/2020  Diffusely heterogeneous thyroid without discrete nodules.  ASSESSMENT / PLAN / RECOMMENDATIONS:   Hashimoto's Thyroiditis :   - She has been without levothyroxine  for a week - We opted not to check TSH today and she will return in 8 weeks for TSH recheck   - Pt educated extensively on the correct way to take levothyroxine  (first thing in the morning with water, 30 minutes before eating or taking other medications). - Pt encouraged to double dose the following day if she were to miss a dose given long half-life of levothyroxine .   Medications   Continue levothyroxine  100 mcg daily     F/U in 1 yr     Signed electronically by: Natale Bail, MD  [facility]  [facility] [address] [place] [address] [place], [place] [address] Phone: [phone] FAX: [phone]      CC: [facility] [address] [place] [address] Phone: [phone]  Fax: [phone]   Return to Endocrinology clinic as below: Future Appointments  Date Time Provider Department Center  11/11/2023 10:30 AM Lynn Sissel, Julian Obey, MD [id number] None

## 2024-02-05 ENCOUNTER — Telehealth: Payer: Self-pay

## 2024-02-05 ENCOUNTER — Other Ambulatory Visit: Payer: Self-pay | Admitting: Internal Medicine

## 2024-02-05 NOTE — Telephone Encounter (Signed)
 Contact patient for a follow up appointment

## 2024-02-06 NOTE — Telephone Encounter (Signed)
 LMx2 to schedule follow up appointment.

## 2024-02-07 NOTE — Telephone Encounter (Signed)
 LMx3 to schedule follow up appointment with Dr. Sam.

## 2024-03-10 ENCOUNTER — Emergency Department (HOSPITAL_BASED_OUTPATIENT_CLINIC_OR_DEPARTMENT_OTHER)
Admission: EM | Admit: 2024-03-10 | Discharge: 2024-03-10 | Disposition: A | Discharge status: Home / Self Care | Payer: Self-pay | Attending: Emergency Medicine | Admitting: Emergency Medicine

## 2024-03-10 ENCOUNTER — Other Ambulatory Visit: Payer: Self-pay

## 2024-03-10 ENCOUNTER — Emergency Department (HOSPITAL_BASED_OUTPATIENT_CLINIC_OR_DEPARTMENT_OTHER): Payer: Self-pay

## 2024-03-10 DIAGNOSIS — R059 Cough, unspecified: Secondary | ICD-10-CM | POA: Insufficient documentation

## 2024-03-10 DIAGNOSIS — E871 Hypo-osmolality and hyponatremia: Secondary | ICD-10-CM | POA: Insufficient documentation

## 2024-03-10 DIAGNOSIS — N12 Tubulo-interstitial nephritis, not specified as acute or chronic: Secondary | ICD-10-CM | POA: Insufficient documentation

## 2024-03-10 DIAGNOSIS — E039 Hypothyroidism, unspecified: Secondary | ICD-10-CM | POA: Insufficient documentation

## 2024-03-10 DIAGNOSIS — D72829 Elevated white blood cell count, unspecified: Secondary | ICD-10-CM | POA: Insufficient documentation

## 2024-03-10 LAB — CBC WITH DIFFERENTIAL/PLATELET
Abs Immature Granulocytes: 0.09 K/uL — ABNORMAL HIGH (ref 0.00–0.07)
Basophils Absolute: 0 K/uL (ref 0.0–0.1)
Basophils Relative: 0 %
Eosinophils Absolute: 0 K/uL (ref 0.0–0.5)
Eosinophils Relative: 0 %
HCT: 40.9 % (ref 36.0–46.0)
Hemoglobin: 13.7 g/dL (ref 12.0–15.0)
Immature Granulocytes: 1 %
Lymphocytes Relative: 5 %
Lymphs Abs: 0.8 K/uL (ref 0.7–4.0)
MCH: 30.2 pg (ref 26.0–34.0)
MCHC: 33.5 g/dL (ref 30.0–36.0)
MCV: 90.3 fL (ref 80.0–100.0)
Monocytes Absolute: 1.2 K/uL — ABNORMAL HIGH (ref 0.1–1.0)
Monocytes Relative: 8 %
Neutro Abs: 12.7 K/uL — ABNORMAL HIGH (ref 1.7–7.7)
Neutrophils Relative %: 86 %
Platelets: 297 K/uL (ref 150–400)
RBC: 4.53 MIL/uL (ref 3.87–5.11)
RDW: 12.3 % (ref 11.5–15.5)
WBC: 14.8 K/uL — ABNORMAL HIGH (ref 4.0–10.5)
nRBC: 0 % (ref 0.0–0.2)

## 2024-03-10 LAB — CBC
HCT: 39.3 % (ref 36.0–46.0)
Hemoglobin: 13.6 g/dL (ref 12.0–15.0)
MCH: 30.8 pg (ref 26.0–34.0)
MCHC: 34.6 g/dL (ref 30.0–36.0)
MCV: 89.1 fL (ref 80.0–100.0)
Platelets: 267 K/uL (ref 150–400)
RBC: 4.41 MIL/uL (ref 3.87–5.11)
RDW: 12.2 % (ref 11.5–15.5)
WBC: 14.7 K/uL — ABNORMAL HIGH (ref 4.0–10.5)
nRBC: 0 % (ref 0.0–0.2)

## 2024-03-10 LAB — URINALYSIS, ROUTINE W REFLEX MICROSCOPIC
Bacteria, UA: NONE SEEN
Bacteria, UA: NONE SEEN
Bilirubin Urine: NEGATIVE
Bilirubin Urine: NEGATIVE
Glucose, UA: NEGATIVE mg/dL
Glucose, UA: NEGATIVE mg/dL
Hgb urine dipstick: NEGATIVE
Ketones, ur: NEGATIVE mg/dL
Ketones, ur: NEGATIVE mg/dL
Leukocytes,Ua: NEGATIVE
Leukocytes,Ua: NEGATIVE
Nitrite: NEGATIVE
Nitrite: NEGATIVE
Protein, ur: 30 mg/dL — AB
Protein, ur: 100 mg/dL — AB
Specific Gravity, Urine: 1.038 — ABNORMAL HIGH (ref 1.005–1.030)
Specific Gravity, Urine: 1.027 (ref 1.005–1.030)
pH: 6.5 (ref 5.0–8.0)
pH: 6.5 (ref 5.0–8.0)

## 2024-03-10 LAB — COMPREHENSIVE METABOLIC PANEL WITH GFR
ALT: 19 U/L (ref 0–44)
AST: 27 U/L (ref 15–41)
Albumin: 4.3 g/dL (ref 3.5–5.0)
Alkaline Phosphatase: 79 U/L (ref 38–126)
Anion gap: 15 (ref 5–15)
BUN: 12 mg/dL (ref 6–20)
CO2: 25 mmol/L (ref 22–32)
Calcium: 10.1 mg/dL (ref 8.9–10.3)
Chloride: 89 mmol/L — ABNORMAL LOW (ref 98–111)
Creatinine, Ser: 0.86 mg/dL (ref 0.44–1.00)
GFR, Estimated: >60 mL/min (ref >60)
Glucose, Bld: 141 mg/dL — ABNORMAL HIGH (ref 70–99)
Potassium: 3.6 mmol/L (ref 3.5–5.1)
Sodium: 129 mmol/L — ABNORMAL LOW (ref 135–145)
Total Bilirubin: 0.5 mg/dL (ref 0.0–1.2)
Total Protein: 7.9 g/dL (ref 6.5–8.1)

## 2024-03-10 LAB — LACTIC ACID, PLASMA: Lactic Acid, Venous: 1.7 mmol/L (ref 0.5–1.9)

## 2024-03-10 LAB — TSH: TSH: 5.91 u[IU]/mL — ABNORMAL HIGH (ref 0.350–4.500)

## 2024-03-10 LAB — LIPASE, BLOOD: Lipase: 29 U/L (ref 11–51)

## 2024-03-10 LAB — T4, FREE: Free T4: 0.7 ng/dL (ref 0.61–1.12)

## 2024-03-10 MED ORDER — SODIUM CHLORIDE 0.9 % IV SOLN
2.0000 g | Freq: Once | INTRAVENOUS | Status: AC
  Administered 2024-03-10: 2 g via INTRAVENOUS
  Filled 2024-03-10: qty 20

## 2024-03-10 MED ORDER — CEFPODOXIME PROXETIL 200 MG PO TABS: 200.0000 mg | ORAL_TABLET | Freq: Two times a day (BID) | ORAL | 0 refills | Status: AC

## 2024-03-10 MED ORDER — ONDANSETRON HCL 4 MG/2ML IJ SOLN
4.0000 mg | Freq: Once | INTRAMUSCULAR | Status: AC
  Administered 2024-03-10: 4 mg via INTRAVENOUS
  Filled 2024-03-10: qty 2

## 2024-03-10 MED ORDER — KETOROLAC TROMETHAMINE 15 MG/ML IJ SOLN
15.0000 mg | Freq: Once | INTRAMUSCULAR | Status: AC
  Administered 2024-03-10: 15 mg via INTRAVENOUS
  Filled 2024-03-10: qty 1

## 2024-03-10 MED ORDER — SODIUM CHLORIDE 0.9 % IV BOLUS
1000.0000 mL | Freq: Once | INTRAVENOUS | Status: AC
  Administered 2024-03-10: 1000 mL via INTRAVENOUS

## 2024-03-10 MED ORDER — ACETAMINOPHEN 500 MG PO TABS
1000.0000 mg | ORAL_TABLET | Freq: Four times a day (QID) | ORAL | Status: DC | PRN
  Administered 2024-03-10: 1000 mg via ORAL
  Filled 2024-03-10: qty 2

## 2024-03-10 MED ORDER — ONDANSETRON HCL 4 MG PO TABS: 4.0000 mg | ORAL_TABLET | Freq: Four times a day (QID) | ORAL | 0 refills | Status: AC

## 2024-03-10 NOTE — ED Notes (Signed)
 Lab called for UC add on

## 2024-03-10 NOTE — Discharge Instructions (Addendum)
 Take antibiotic as prescribed.  Stay well-hydrated.  Take Zofran  as needed for nausea vomiting. Take 1000mg  of Tylenol  every 8 hours for 2-3 days then as needed for fever. Follow-up with primary care or return to ER with new or worsening symptoms.  Follow up with TSH, T4 results (Thyroid) with PCP.

## 2024-03-10 NOTE — ED Notes (Signed)
 Pt given discharge instructions. Opportunities given for questions. IV removed. Pt stable at time of discharge.

## 2024-03-10 NOTE — ED Triage Notes (Addendum)
 Patient states fever, nausea/vomiting, and body aches for 4 days. Negative for covid/flu at urgent care

## 2024-03-10 NOTE — ED Provider Notes (Signed)
 [facility] Provider Note   CSN: [id number] Arrival date & time: 03/10/24  1207     Patient presents with: Fever   Glenda Smith is a 53 y.o. female patient with past medical history of Hashimoto encephalitis, bipolar 1, anxiety presenting to emergency room with primary complaint of bodyaches.  Patient reports that for the last 4 days she has had fever and chills at home.  She has had nausea and vomiting.  Also reports she has had cough. Reports bilateral flank pain. Denies abdominal pain, central back pain, chest pain or shortness of breath.  Reports documented fever at home of 24 F that came down with Tylenol  just prior to arrival.  She had negative flu and COVID test done at urgent care earlier today.  She also reports that she has not been taking levothyroxine  regularly due to her illness and difficulty taking the medication.  Reports thyroid was last checked in May and was abnormal. Denies metal anesthesia, loss of bowel or bladder.  No history of IV drug use.  No history of cancer.    Fever      Prior to Admission medications   Medication Sig Start Date End Date Taking? Authorizing Provider  ALPRAZolam (XANAX) 0.5 MG tablet Take 0.5-1 mg by mouth daily as needed. 02/02/24  Yes [provider]  amphetamine-dextroamphetamine (ADDERALL) 20 MG tablet Take 20 mg by mouth 3 (three) times daily. 04/28/21   [provider]  cholecalciferol (VITAMIN D3) 25 MCG (1000 UNIT) tablet Take 1,000 Units by mouth daily.    [provider]  cloNIDine (CATAPRES) 0.1 MG tablet Take 0.1-0.3 mg by mouth at bedtime. 05/24/21   [provider]  doxepin (SINEQUAN) 10 MG capsule Take 10-30 mg by mouth at bedtime. 04/15/21   [provider]  levothyroxine  (SYNTHROID ) 100 MCG tablet Take 1 tablet by mouth once daily 11/07/23   Shamleffer, Ibtehal Jaralla, MD  ondansetron  (ZOFRAN ) 4 MG tablet Take 1 tablet (4 mg total) by  mouth every 8 (eight) hours as needed for nausea or vomiting. 01/17/22   Logan Ubaldo NOVAK, PA-C  traZODone  (DESYREL ) 100 MG tablet Take 300 mg by mouth at bedtime as needed. 11/02/22   [provider]  vitamin B-12 1000 MCG tablet Take 1 tablet (1,000 mcg total) by mouth daily. 06/02/20   Cheryle Page, MD    Allergies: Patient has no known allergies.    Review of Systems  Constitutional:  Positive for fever.    Updated Vital Signs BP (!) 145/99 (BP Location: Right Arm)   Pulse (!) 106   Temp 98.5 F (36.9 C)   Resp 17   SpO2 100%   Physical Exam Vitals and nursing note reviewed.  Constitutional:      General: She is not in acute distress.    Appearance: She is not toxic-appearing.  HENT:     Head: Normocephalic and atraumatic.  Eyes:     General: No scleral icterus.    Conjunctiva/sclera: Conjunctivae normal.  Cardiovascular:     Rate and Rhythm: Regular rhythm.     Pulses: Normal pulses.     Heart sounds: Normal heart sounds.  Pulmonary:     Effort: Pulmonary effort is normal. No respiratory distress.     Breath sounds: Normal breath sounds.  Abdominal:     General: Abdomen is flat. Bowel sounds are normal. There is no distension.     Palpations: Abdomen is soft. There is no mass.  Tenderness: There is no abdominal tenderness. There is right CVA tenderness and left CVA tenderness.  Musculoskeletal:     Right lower leg: No edema.     Left lower leg: No edema.  Skin:    General: Skin is warm and dry.     Findings: No lesion.  Neurological:     General: No focal deficit present.     Mental Status: She is alert and oriented to person, place, and time. Mental status is at baseline.     (all labs ordered are listed, but only abnormal results are displayed) Labs Reviewed  COMPREHENSIVE METABOLIC PANEL WITH GFR - Abnormal; Notable for the following components:      Result Value   Sodium 129 (*)    Chloride 89 (*)    Glucose, Bld 141 (*)    All other  components within normal limits  CBC - Abnormal; Notable for the following components:   WBC 14.7 (*)    All other components within normal limits  URINALYSIS, ROUTINE W REFLEX MICROSCOPIC - Abnormal; Notable for the following components:   Color, Urine   (*)    Value: PATIENT IDENTIFICATION ERROR. PLEASE DISREGARD RESULTS. ACCOUNT WILL BE CREDITED.   Specific Gravity, Urine 1.038 (*)    Protein, ur 30 (*)    All other components within normal limits  TSH - Abnormal; Notable for the following components:   TSH 5.910 (*)    All other components within normal limits  URINALYSIS, ROUTINE W REFLEX MICROSCOPIC - Abnormal; Notable for the following components:   Hgb urine dipstick MODERATE (*)    Protein, ur 100 (*)    All other components within normal limits  CBC WITH DIFFERENTIAL/PLATELET - Abnormal; Notable for the following components:   WBC 14.8 (*)    Neutro Abs 12.7 (*)    Monocytes Absolute 1.2 (*)    Abs Immature Granulocytes 0.09 (*)    All other components within normal limits  CULTURE, BLOOD (ROUTINE X 2)  CULTURE, BLOOD (ROUTINE X 2)  URINE CULTURE  LIPASE, BLOOD  LACTIC ACID, PLASMA  T4, FREE    EKG: None  Radiology: CT Renal Stone Study Result Date: 03/10/2024 CLINICAL DATA:  Abdominal pain.  Concern for kidney stone. EXAM: CT ABDOMEN AND PELVIS WITHOUT CONTRAST TECHNIQUE: Multidetector CT imaging of the abdomen and pelvis was performed following the standard protocol without IV contrast. RADIATION DOSE REDUCTION: This exam was performed according to the departmental dose-optimization program which includes automated exposure control, adjustment of the mA and/or kV according to patient size and/or use of iterative reconstruction technique. COMPARISON:  CT dated 05/27/2020. FINDINGS: Evaluation of this exam is limited in the absence of intravenous contrast. Lower chest: The visualized lung bases are clear. No intra-abdominal free air or free fluid. Hepatobiliary: The  liver is unremarkable. No biliary dilatation. The gallbladder is unremarkable. Pancreas: Unremarkable. No pancreatic ductal dilatation or surrounding inflammatory changes. Spleen: Normal in size without focal abnormality. Adrenals/Urinary Tract: The adrenal glands are unremarkable. There is no hydronephrosis or nephrolithiasis on either side. Left perinephric stranding, nonspecific. Correlation with urinalysis recommended to exclude pyelonephritis. The urinary bladder is grossly unremarkable. Stomach/Bowel: Small hiatal hernia. There is no bowel obstruction or active inflammation. The appendix is not visualized with certainty. No inflammatory changes identified in the right lower quadrant. Vascular/Lymphatic: The abdominal aorta and IVC are grossly unremarkable on this noncontrast CT. No portal venous gas. There is no adenopathy. Reproductive: The uterus is grossly unremarkable. No suspicious adnexal masses. Other:  None Musculoskeletal: Degenerative changes of the spine. No acute osseous pathology. IMPRESSION: 1. No hydronephrosis or nephrolithiasis. 2. Left perinephric stranding, nonspecific. Correlation with urinalysis recommended to exclude pyelonephritis. 3. No bowel obstruction. Electronically Signed   By: Vanetta Chou M.D.   On: 03/10/2024 17:01   DG Chest Portable 1 View Result Date: 03/10/2024 CLINICAL DATA:  Cough. EXAM: PORTABLE CHEST 1 VIEW COMPARISON:  CT chest dated 05/27/2020. FINDINGS: The heart size and mediastinal contours are within normal limits. No focal consolidation, pleural effusion, or pneumothorax. No acute osseous abnormality. IMPRESSION: No acute cardiopulmonary findings. Electronically Signed   By: Harrietta Sherry M.D.   On: 03/10/2024 14:47     Procedures   Medications Ordered in the ED  acetaminophen  (TYLENOL ) tablet 1,000 mg (1,000 mg Oral Given 03/10/24 1656)  sodium chloride  0.9 % bolus 1,000 mL (0 mLs Intravenous Stopped 03/10/24 1653)  ondansetron  (ZOFRAN ) injection  4 mg (4 mg Intravenous Given 03/10/24 1500)  ketorolac  (TORADOL ) 15 MG/ML injection 15 mg (15 mg Intravenous Given 03/10/24 1500)  cefTRIAXone  (ROCEPHIN ) 2 g in sodium chloride  0.9 % 100 mL IVPB (0 g Intravenous Stopped 03/10/24 1944)  ondansetron  (ZOFRAN ) injection 4 mg (4 mg Intravenous Given 03/10/24 1940)    Clinical Course as of 03/10/24 2014  Tue Mar 10, 2024  1639 Temp(!): 101.4 F (38.6 C) [JB]  1639 WBC(!): 14.7 [JB]  1852 Passed PO challenge. Tolerating oral intake. Feeling better.  [JB]    Clinical Course User Index [JB] Mayford Alberg, Warren SAILOR, PA-C                                 Medical Decision Making Amount and/or Complexity of Data Reviewed Labs: ordered. Decision-making details documented in ED Course. Radiology: ordered.  Risk OTC drugs. Prescription drug management.   This patient presents to the ED for concern of fever, this involves an extensive number of treatment options, and is a complaint that carries with it a high risk of complications and morbidity.  The differential diagnosis includes bacteremia, viral URI, pyelonephritis, kidney stone, pneumonia   Co morbidities that complicate the patient evaluation  Hypothyroid    Lab Tests:  I personally interpreted labs.  The pertinent results include:   Patient has leukocytosis of 14.8, no anemia.  CMP with mild hyponatremia at 129, normal electrolytes.  Normal kidney and liver function. UA does have rare RBC, WBC, no bacteria, nitrites or leukocytes.  TSH and T4 drawn here, patient to follow up with PCP  Blood cultures pending.    Imaging Studies ordered:  I ordered imaging studies including chest x-ray, renal study   I independently visualized and interpreted imaging which showed left perinephric stranding correlate with UA for pyelo I agree with the radiologist interpretation   Cardiac Monitoring: / EKG:  The patient was maintained on a cardiac monitor.     Problem List / ED Course / Critical  interventions / Medication management  Patient presented to emergency room with complaint of fever and bodyaches.  Patient reports has been feeling like this for approximately 3 days.  On arrival she was mildly tachycardic at 106 although, afebrile, normotensive and well-appearing.  Patient complains of cough and URI-like symptoms.  She had negative respiratory panel done at urgent care earlier today.  She has no evidence of pneumonia or acute abnormality on chest x-ray.  She also complains of bodyaches, bilateral flank pain.  On my exam she does have  bilateral CVA tenderness but UA shows no convincing urinary tract infection. Her CT scan does show nonspecific left perinephric stranding and no evidence of stone. On recheck of patient's temperature she had fever of 101 F thus blood cultures and lactic were added on.  Patient's lactic was within normal limits.  Patient's feeling better after fluids, Toradol  Tylenol  here.  Fever reduced appropriately.  Discussed that patient's UA is not grossly positive for UA but CT scan does show pyelonephritis, patient like to proceed with treatment and was given 1 dose of Rocephin  here. Discussed with attending who agrees to plan. Will be sent home on oral antibiotics.   Admission and observation were considered. Ultimately she is feeling better.  She is tolerating oral intake, she would like to go. Feel appropriate for discharge with outpatient.       Final diagnoses:  Pyelonephritis    ED Discharge Orders          Ordered    cefpodoxime  (VANTIN ) 200 MG tablet  2 times daily        03/10/24 1909    ondansetron  (ZOFRAN ) 4 MG tablet  Every 6 hours        03/10/24 1909               Ermel Verne N, PA-C 03/10/24 2038    Tegeler, Lonni PARAS, MD 03/13/24 [phone]

## 2024-03-10 NOTE — ED Notes (Signed)
 Pt can tolerate PO fluids

## 2024-03-11 LAB — URINE CULTURE: Culture: NO GROWTH

## 2024-03-15 LAB — CULTURE, BLOOD (ROUTINE X 2)
Culture: NO GROWTH
Culture: NO GROWTH
Special Requests: ADEQUATE
Special Requests: ADEQUATE
